# Patient Record
Sex: Female | Born: 1940 | Race: White | Hispanic: No | Smoking: Current some day smoker
Health system: Southern US, Community
[De-identification: ages and names within clinical notes are randomized; demographics above are authoritative.]

## PROBLEM LIST (undated history)

## (undated) ENCOUNTER — Emergency Department (HOSPITAL_COMMUNITY): Discharge status: Against Medical Advice | Payer: Medicare Other

## (undated) DIAGNOSIS — I480 Paroxysmal atrial fibrillation: Secondary | ICD-10-CM

## (undated) DIAGNOSIS — Z720 Tobacco use: Secondary | ICD-10-CM

## (undated) DIAGNOSIS — E559 Vitamin D deficiency, unspecified: Secondary | ICD-10-CM

## (undated) DIAGNOSIS — I10 Essential (primary) hypertension: Secondary | ICD-10-CM

## (undated) DIAGNOSIS — Z950 Presence of cardiac pacemaker: Secondary | ICD-10-CM

## (undated) DIAGNOSIS — Z9289 Personal history of other medical treatment: Secondary | ICD-10-CM

## (undated) DIAGNOSIS — E079 Disorder of thyroid, unspecified: Secondary | ICD-10-CM

## (undated) DIAGNOSIS — E785 Hyperlipidemia, unspecified: Secondary | ICD-10-CM

## (undated) DIAGNOSIS — I422 Other hypertrophic cardiomyopathy: Secondary | ICD-10-CM

## (undated) DIAGNOSIS — I2510 Atherosclerotic heart disease of native coronary artery without angina pectoris: Secondary | ICD-10-CM

## (undated) DIAGNOSIS — J449 Chronic obstructive pulmonary disease, unspecified: Secondary | ICD-10-CM

## (undated) DIAGNOSIS — I701 Atherosclerosis of renal artery: Secondary | ICD-10-CM

## (undated) HISTORY — PX: OTHER SURGICAL HISTORY: SHX169

## (undated) HISTORY — DX: Personal history of other medical treatment: Z92.89

## (undated) HISTORY — DX: Paroxysmal atrial fibrillation: I48.0

## (undated) HISTORY — DX: Chronic obstructive pulmonary disease, unspecified: J44.9

## (undated) HISTORY — DX: Vitamin D deficiency, unspecified: E55.9

## (undated) HISTORY — PX: BREAST BIOPSY: SHX20

## (undated) HISTORY — DX: Atherosclerosis of renal artery: I70.1

## (undated) HISTORY — DX: Tobacco use: Z72.0

## (undated) HISTORY — DX: Hyperlipidemia, unspecified: E78.5

## (undated) HISTORY — DX: Other hypertrophic cardiomyopathy: I42.2

---

## 1967-03-27 HISTORY — PX: TUBAL LIGATION: SHX77

## 1997-05-21 ENCOUNTER — Ambulatory Visit (HOSPITAL_COMMUNITY): Admission: RE | Admit: 1997-05-21 | Discharge: 1997-05-21 | Payer: Self-pay | Admitting: Internal Medicine

## 1998-05-31 ENCOUNTER — Ambulatory Visit (HOSPITAL_COMMUNITY): Admission: RE | Admit: 1998-05-31 | Discharge: 1998-05-31 | Payer: Self-pay | Admitting: Internal Medicine

## 1998-05-31 ENCOUNTER — Encounter: Payer: Self-pay | Admitting: Internal Medicine

## 1998-06-22 ENCOUNTER — Encounter: Payer: Self-pay | Admitting: Internal Medicine

## 1998-06-22 ENCOUNTER — Ambulatory Visit (HOSPITAL_COMMUNITY): Admission: RE | Admit: 1998-06-22 | Discharge: 1998-06-22 | Payer: Self-pay | Admitting: Internal Medicine

## 1998-06-30 ENCOUNTER — Ambulatory Visit (HOSPITAL_COMMUNITY): Admission: RE | Admit: 1998-06-30 | Discharge: 1998-06-30 | Payer: Self-pay | Admitting: Internal Medicine

## 1998-06-30 ENCOUNTER — Encounter: Payer: Self-pay | Admitting: Internal Medicine

## 1998-10-20 ENCOUNTER — Observation Stay (HOSPITAL_COMMUNITY): Admission: RE | Admit: 1998-10-20 | Discharge: 1998-10-21 | Payer: Self-pay | Admitting: Cardiovascular Disease

## 1998-10-20 ENCOUNTER — Encounter: Payer: Self-pay | Admitting: Cardiovascular Disease

## 1998-10-20 HISTORY — PX: RENAL ARTERY ANGIOPLASTY: SHX2317

## 1998-12-15 ENCOUNTER — Encounter: Payer: Self-pay | Admitting: Emergency Medicine

## 1998-12-15 ENCOUNTER — Inpatient Hospital Stay (HOSPITAL_COMMUNITY): Admission: EM | Admit: 1998-12-15 | Discharge: 1998-12-16 | Payer: Self-pay | Admitting: Emergency Medicine

## 1999-07-04 ENCOUNTER — Ambulatory Visit (HOSPITAL_COMMUNITY): Admission: RE | Admit: 1999-07-04 | Discharge: 1999-07-04 | Payer: Self-pay | Admitting: Internal Medicine

## 1999-07-04 ENCOUNTER — Encounter: Payer: Self-pay | Admitting: Internal Medicine

## 1999-12-14 ENCOUNTER — Encounter (INDEPENDENT_AMBULATORY_CARE_PROVIDER_SITE_OTHER): Payer: Self-pay | Admitting: Specialist

## 1999-12-14 ENCOUNTER — Other Ambulatory Visit: Admission: RE | Admit: 1999-12-14 | Discharge: 1999-12-14 | Payer: Self-pay | Admitting: Otolaryngology

## 2000-05-08 ENCOUNTER — Other Ambulatory Visit: Admission: RE | Admit: 2000-05-08 | Discharge: 2000-05-08 | Payer: Self-pay | Admitting: Internal Medicine

## 2000-05-09 ENCOUNTER — Ambulatory Visit (HOSPITAL_COMMUNITY): Admission: RE | Admit: 2000-05-09 | Discharge: 2000-05-09 | Payer: Self-pay | Admitting: Internal Medicine

## 2000-05-09 ENCOUNTER — Encounter: Payer: Self-pay | Admitting: Internal Medicine

## 2000-07-23 ENCOUNTER — Ambulatory Visit (HOSPITAL_COMMUNITY): Admission: RE | Admit: 2000-07-23 | Discharge: 2000-07-23 | Payer: Self-pay | Admitting: Internal Medicine

## 2000-07-23 ENCOUNTER — Encounter: Payer: Self-pay | Admitting: Internal Medicine

## 2001-08-07 ENCOUNTER — Encounter: Payer: Self-pay | Admitting: Internal Medicine

## 2001-08-07 ENCOUNTER — Ambulatory Visit (HOSPITAL_COMMUNITY): Admission: RE | Admit: 2001-08-07 | Discharge: 2001-08-07 | Payer: Self-pay | Admitting: Internal Medicine

## 2001-09-05 ENCOUNTER — Encounter: Payer: Self-pay | Admitting: Internal Medicine

## 2001-09-05 ENCOUNTER — Ambulatory Visit (HOSPITAL_COMMUNITY): Admission: RE | Admit: 2001-09-05 | Discharge: 2001-09-05 | Payer: Self-pay | Admitting: Internal Medicine

## 2001-09-10 ENCOUNTER — Encounter: Payer: Self-pay | Admitting: Internal Medicine

## 2001-09-10 ENCOUNTER — Encounter: Admission: RE | Admit: 2001-09-10 | Discharge: 2001-09-10 | Payer: Self-pay | Admitting: Internal Medicine

## 2001-09-11 ENCOUNTER — Encounter: Admission: RE | Admit: 2001-09-11 | Discharge: 2001-09-11 | Payer: Self-pay | Admitting: Urology

## 2001-09-11 ENCOUNTER — Encounter: Payer: Self-pay | Admitting: Urology

## 2001-09-15 ENCOUNTER — Encounter: Payer: Self-pay | Admitting: Urology

## 2001-09-15 ENCOUNTER — Ambulatory Visit (HOSPITAL_BASED_OUTPATIENT_CLINIC_OR_DEPARTMENT_OTHER): Admission: RE | Admit: 2001-09-15 | Discharge: 2001-09-15 | Payer: Self-pay | Admitting: Urology

## 2001-09-18 ENCOUNTER — Encounter: Payer: Self-pay | Admitting: Emergency Medicine

## 2001-09-19 ENCOUNTER — Observation Stay (HOSPITAL_COMMUNITY): Admission: EM | Admit: 2001-09-19 | Discharge: 2001-09-19 | Payer: Self-pay | Admitting: *Deleted

## 2001-09-22 ENCOUNTER — Encounter: Admission: RE | Admit: 2001-09-22 | Discharge: 2001-09-22 | Payer: Self-pay | Admitting: Urology

## 2001-09-22 ENCOUNTER — Encounter: Payer: Self-pay | Admitting: Urology

## 2001-09-29 ENCOUNTER — Encounter: Payer: Self-pay | Admitting: Urology

## 2001-09-29 ENCOUNTER — Encounter: Admission: RE | Admit: 2001-09-29 | Discharge: 2001-09-29 | Payer: Self-pay | Admitting: Urology

## 2001-10-14 ENCOUNTER — Encounter: Admission: RE | Admit: 2001-10-14 | Discharge: 2001-10-14 | Payer: Self-pay | Admitting: Urology

## 2001-10-14 ENCOUNTER — Encounter: Payer: Self-pay | Admitting: Urology

## 2002-08-14 ENCOUNTER — Ambulatory Visit (HOSPITAL_COMMUNITY): Admission: RE | Admit: 2002-08-14 | Discharge: 2002-08-14 | Payer: Self-pay | Admitting: Internal Medicine

## 2002-08-14 ENCOUNTER — Encounter: Payer: Self-pay | Admitting: Internal Medicine

## 2002-08-21 ENCOUNTER — Encounter: Payer: Self-pay | Admitting: Internal Medicine

## 2002-08-21 ENCOUNTER — Encounter: Admission: RE | Admit: 2002-08-21 | Discharge: 2002-08-21 | Payer: Self-pay | Admitting: Internal Medicine

## 2002-12-09 ENCOUNTER — Encounter (HOSPITAL_COMMUNITY): Admission: RE | Admit: 2002-12-09 | Discharge: 2003-03-09 | Payer: Self-pay | Admitting: Internal Medicine

## 2002-12-10 ENCOUNTER — Encounter: Payer: Self-pay | Admitting: Internal Medicine

## 2003-08-16 ENCOUNTER — Encounter: Admission: RE | Admit: 2003-08-16 | Discharge: 2003-08-16 | Payer: Self-pay | Admitting: Internal Medicine

## 2005-01-22 ENCOUNTER — Encounter: Admission: RE | Admit: 2005-01-22 | Discharge: 2005-01-22 | Payer: Self-pay | Admitting: Internal Medicine

## 2005-02-19 ENCOUNTER — Ambulatory Visit (HOSPITAL_COMMUNITY): Admission: RE | Admit: 2005-02-19 | Discharge: 2005-02-19 | Payer: Self-pay | Admitting: Ophthalmology

## 2005-02-21 ENCOUNTER — Ambulatory Visit (HOSPITAL_COMMUNITY): Admission: RE | Admit: 2005-02-21 | Discharge: 2005-02-21 | Payer: Self-pay | Admitting: Ophthalmology

## 2005-07-22 ENCOUNTER — Inpatient Hospital Stay (HOSPITAL_COMMUNITY): Admission: EM | Admit: 2005-07-22 | Discharge: 2005-07-28 | Payer: Self-pay | Admitting: Emergency Medicine

## 2005-07-23 HISTORY — PX: CARDIAC CATHETERIZATION: SHX172

## 2005-07-24 DIAGNOSIS — Z950 Presence of cardiac pacemaker: Secondary | ICD-10-CM

## 2005-07-24 HISTORY — DX: Presence of cardiac pacemaker: Z95.0

## 2005-07-24 HISTORY — PX: PACEMAKER INSERTION: SHX728

## 2005-07-30 ENCOUNTER — Encounter: Admission: RE | Admit: 2005-07-30 | Discharge: 2005-07-30 | Payer: Self-pay | Admitting: Cardiology

## 2005-08-08 ENCOUNTER — Encounter: Admission: RE | Admit: 2005-08-08 | Discharge: 2005-08-08 | Payer: Self-pay | Admitting: Thoracic Surgery

## 2005-09-14 ENCOUNTER — Emergency Department (HOSPITAL_COMMUNITY): Admission: EM | Admit: 2005-09-14 | Discharge: 2005-09-14 | Payer: Self-pay | Admitting: Emergency Medicine

## 2006-01-16 ENCOUNTER — Observation Stay (HOSPITAL_COMMUNITY): Admission: EM | Admit: 2006-01-16 | Discharge: 2006-01-16 | Payer: Self-pay | Admitting: Emergency Medicine

## 2006-01-23 ENCOUNTER — Encounter: Admission: RE | Admit: 2006-01-23 | Discharge: 2006-01-23 | Payer: Self-pay | Admitting: Internal Medicine

## 2006-12-11 ENCOUNTER — Emergency Department (HOSPITAL_COMMUNITY): Admission: EM | Admit: 2006-12-11 | Discharge: 2006-12-11 | Payer: Self-pay | Admitting: Emergency Medicine

## 2007-01-28 ENCOUNTER — Encounter: Admission: RE | Admit: 2007-01-28 | Discharge: 2007-01-28 | Payer: Self-pay | Admitting: Internal Medicine

## 2007-02-05 ENCOUNTER — Encounter: Admission: RE | Admit: 2007-02-05 | Discharge: 2007-02-05 | Payer: Self-pay | Admitting: Internal Medicine

## 2007-03-11 ENCOUNTER — Other Ambulatory Visit: Admission: RE | Admit: 2007-03-11 | Discharge: 2007-03-11 | Payer: Self-pay | Admitting: Internal Medicine

## 2007-03-17 ENCOUNTER — Ambulatory Visit (HOSPITAL_COMMUNITY): Admission: RE | Admit: 2007-03-17 | Discharge: 2007-03-17 | Payer: Self-pay | Admitting: Internal Medicine

## 2007-07-25 ENCOUNTER — Encounter: Admission: RE | Admit: 2007-07-25 | Discharge: 2007-07-25 | Payer: Self-pay | Admitting: Internal Medicine

## 2008-01-05 ENCOUNTER — Encounter: Admission: RE | Admit: 2008-01-05 | Discharge: 2008-01-05 | Payer: Self-pay | Admitting: Internal Medicine

## 2008-02-16 ENCOUNTER — Encounter: Admission: RE | Admit: 2008-02-16 | Discharge: 2008-02-16 | Payer: Self-pay | Admitting: Internal Medicine

## 2008-04-01 ENCOUNTER — Encounter: Payer: Self-pay | Admitting: Gastroenterology

## 2008-04-23 ENCOUNTER — Ambulatory Visit: Payer: Self-pay | Admitting: Gastroenterology

## 2008-04-27 ENCOUNTER — Telehealth: Payer: Self-pay | Admitting: Gastroenterology

## 2008-04-28 ENCOUNTER — Ambulatory Visit: Payer: Self-pay | Admitting: Gastroenterology

## 2008-04-28 DIAGNOSIS — D68318 Other hemorrhagic disorder due to intrinsic circulating anticoagulants, antibodies, or inhibitors: Secondary | ICD-10-CM | POA: Insufficient documentation

## 2008-04-28 DIAGNOSIS — I4891 Unspecified atrial fibrillation: Secondary | ICD-10-CM | POA: Insufficient documentation

## 2008-04-28 DIAGNOSIS — I509 Heart failure, unspecified: Secondary | ICD-10-CM | POA: Insufficient documentation

## 2008-05-07 ENCOUNTER — Ambulatory Visit: Payer: Self-pay | Admitting: Gastroenterology

## 2008-05-24 LAB — HM COLONOSCOPY

## 2009-02-02 ENCOUNTER — Emergency Department (HOSPITAL_COMMUNITY): Admission: EM | Admit: 2009-02-02 | Discharge: 2009-02-02 | Payer: Self-pay | Admitting: Emergency Medicine

## 2009-02-16 ENCOUNTER — Encounter: Admission: RE | Admit: 2009-02-16 | Discharge: 2009-02-16 | Payer: Self-pay | Admitting: Internal Medicine

## 2009-02-25 ENCOUNTER — Encounter: Admission: RE | Admit: 2009-02-25 | Discharge: 2009-02-25 | Payer: Self-pay | Admitting: Internal Medicine

## 2009-05-23 ENCOUNTER — Ambulatory Visit (HOSPITAL_COMMUNITY): Admission: RE | Admit: 2009-05-23 | Discharge: 2009-05-23 | Payer: Self-pay | Admitting: Internal Medicine

## 2010-03-02 ENCOUNTER — Encounter
Admission: RE | Admit: 2010-03-02 | Discharge: 2010-03-02 | Payer: Self-pay | Source: Home / Self Care | Attending: Internal Medicine | Admitting: Internal Medicine

## 2010-05-19 ENCOUNTER — Other Ambulatory Visit (HOSPITAL_COMMUNITY): Payer: Self-pay | Admitting: Internal Medicine

## 2010-05-19 ENCOUNTER — Ambulatory Visit (HOSPITAL_COMMUNITY)
Admission: RE | Admit: 2010-05-19 | Discharge: 2010-05-19 | Disposition: A | Discharge status: Home / Self Care | Payer: Medicare Other | Source: Ambulatory Visit | Attending: Internal Medicine | Admitting: Internal Medicine

## 2010-05-19 DIAGNOSIS — F17200 Nicotine dependence, unspecified, uncomplicated: Secondary | ICD-10-CM | POA: Insufficient documentation

## 2010-05-19 DIAGNOSIS — I10 Essential (primary) hypertension: Secondary | ICD-10-CM

## 2010-05-19 DIAGNOSIS — Z950 Presence of cardiac pacemaker: Secondary | ICD-10-CM | POA: Insufficient documentation

## 2010-05-19 DIAGNOSIS — Z0000 Encounter for general adult medical examination without abnormal findings: Secondary | ICD-10-CM | POA: Insufficient documentation

## 2010-06-28 LAB — BASIC METABOLIC PANEL
CO2: 28 mEq/L (ref 19–32)
Calcium: 9.5 mg/dL (ref 8.4–10.5)
Creatinine, Ser: 0.84 mg/dL (ref 0.4–1.2)
GFR calc Af Amer: >60 mL/min (ref >60)
GFR calc non Af Amer: >60 mL/min (ref >60)
Glucose, Bld: 203 mg/dL — ABNORMAL HIGH (ref 70–99)

## 2010-06-28 LAB — CBC
MCV: 88 fL (ref 78.0–100.0)
Platelets: 259 10*3/uL (ref 150–400)
RBC: 4.52 MIL/uL (ref 3.87–5.11)
WBC: 10.9 10*3/uL — ABNORMAL HIGH (ref 4.0–10.5)

## 2010-06-28 LAB — DIFFERENTIAL
Eosinophils Absolute: 0 10*3/uL (ref 0.0–0.7)
Lymphs Abs: 1.2 10*3/uL (ref 0.7–4.0)
Monocytes Relative: 6 % (ref 3–12)
Neutro Abs: 8.9 10*3/uL — ABNORMAL HIGH (ref 1.7–7.7)
Neutrophils Relative %: 82 % — ABNORMAL HIGH (ref 43–77)

## 2010-06-28 LAB — POCT CARDIAC MARKERS
CKMB, poc: 5.7 ng/mL (ref 1.0–8.0)
Myoglobin, poc: 112 ng/mL (ref 12–200)

## 2010-07-25 LAB — HM PAP SMEAR: HM Pap smear: NORMAL

## 2010-08-11 NOTE — Cardiovascular Report (Signed)
NAME:  Deanna Poole, Deanna Poole NO.:  [id number]   MEDICAL RECORD NO.:  [id number]          PATIENT TYPE:  INP   LOCATION:  2916                         FACILITY:  [facility]   PHYSICIAN:  Darlin Priestly, MD  DATE OF BIRTH:  1940-10-06   DATE OF PROCEDURE:  07/23/2005  DATE OF DISCHARGE:                              CARDIAC CATHETERIZATION   PROCEDURES:  1.  Left heart catheterization.  2.  Coronary angiography.  3.  Left ventriculogram.  4.  Bilateral renal angiogram.   ATTENDING:  Darlin Priestly, MD   COMPLICATIONS:  None.   INDICATIONS:  Deanna Poole is a 70 year old female with a history of  hypertension, PVD, status post a right renal PTA and stenting, history of  intermittent palpitations and presyncope who presented to the ER on July 22, 2005 after a syncopal episode.  She was noted at that time to have  atrial fibrillation with significant pauses documented on telemetry.  She is  now referred for a cardiac catheterization to rule out significant CAD.   DESCRIPTION OF PROCEDURE:  After giving informed consent, the patient was  brought to the cardiac catheterization lab.  The right and left groin was  shaved, prepped and draped in the usual sterile fashion.  Anesthesia and  monitors established.  Using modified Seldinger technique, a #6 French  arterial sheath was inserted in the right femoral vein.  A #6 French  diagnostic catheter was used to perform diagnostic angiography.   The left main is a large vessel with no significant disease.   The LAD is a large vessel which is tortuous and courses through the apex is  one diagonal branch.  There is mild calcification of the proximal LAD.  There is a  50-60% stenosis after the takeoff of the first diagonal,  however, there does not appear to be any further significant LAD disease.   The first diagonal is a medium sized vessel which bifurcates distally with  no significant disease.   The left circumflex is a  medium sized vessel that coursed the AV groove,  giving rise to two medium obtuse marginal branches.  The AV groove  circumflex was noted to be mildly calcified in its proximal segment but has  no high grade stenosis.   The first OM is a medium sized vessel which bifurcates distally with no  significant disease.   The second OM is a small vessel with no significant disease.   The right coronary artery is a large vessel, is dominant and gives rise to a  PDA and posterolateral branch.  There is mild 30% mid RCA narrowing with no  further significant disease.   The PDA and posterolateral branch have no significant disease.   Left ventriculogram reveals hyperdynamic EF at 70% with mid cavity  obliteration.   Right renal artery reveals a stent in the ostial portion.  There is an 80%  ostial stenosis with a 30 mm gradient across the stenotic lesion.   Left renal artery reveals mild 30% ostial narrowing.   HEMODYNAMICS:  Right systolic pressure 132/60, LV systolic pressure 140/90,  LVEDP 19.  CONCLUSIONS:  1.  No significant coronary artery disease.  2.  Hyperdynamic left ventricle with normal function.  There is mid cavity      obliteration.  3.  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  4.  Elevated left ventricular end-diastolic pressure.      Darlin Priestly, MD  Electronically Signed     RHM/MEDQ  D:  07/23/2005  T:  07/23/2005  Job:  [id number]   cc:   Nanetta Batty, M.D.  Fax: [phone]

## 2010-08-11 NOTE — H&P (Signed)
NAME:  Deanna Poole NO.:  [id number]   MEDICAL RECORD NO.:  [id number]          PATIENT TYPE:  OBV   LOCATION:  1826                         FACILITY:  [facility]   PHYSICIAN:  Ulyses Amor, MD DATE OF BIRTH:  Feb 04, 1941   DATE OF ADMISSION:  01/16/2006  DATE OF DISCHARGE:                                HISTORY & PHYSICAL   HISTORY OF PRESENT ILLNESS:  Deanna Poole is a 70 year old white woman  who is admitted to [facility] for recurrent atrial fibrillation.   The patient's first episode of atrial fibrillation occurred in April of this  year.  She was hospitalized.  She spontaneously converted to normal sinus  rhythm, but not before she demonstrated several long pauses.  Because of  that, during that hospitalization, a permanent pacemaker was placed.  She  also underwent cardiac catheterization which demonstrated a 50-60% LAD  lesion and a 30% right coronary artery lesion.  The ejection fraction was  70%.  Since that time, she has experienced one additional episode of atrial  fibrillation, for which she was not hospitalized.  The patient presented to  the emergency department after being awakened by a rapid and irregular heart  beat.  There was no associated chest pain, tightness, heaviness, pressure,  squeezing, syncope, near syncope, dizziness or lightheadedness.  In the  emergency department, she was found to be in atrial fibrillation with a  ventricular rate in the 120s.  She has received 1 intravenous dose of  metoprolol and 1 oral dose of metoprolol.  She remains in atrial  fibrillation, but her heart rate is now in the 90s.   In addition to the problems noted above, the patient has a history of  hypertension and hypothyroidism.  She has previously undergone stenting of  the right renal artery, and at the time of cardiac catheterization she was  found to have an 80% restenosis at this site.   PAST MEDICAL HISTORY:  Otherwise  unremarkable.   MEDICATIONS:  1. Amlodipine.  2. Aspirin.  3. Atenolol.  4. Cozaar.  5. Hydrochlorothiazide.  6. Levothroid.  7. Norvasc.  8. Coumadin.  9. Diltiazem.   ALLERGIES:  None.   OPERATIONS:  None.   SOCIAL HISTORY:  The patient is retired.  She is married and lives with her  husband, who has a chronic illness.  She cares for him.  She discontinued  smoking earlier this year.  She does not drink alcohol.   FAMILY HISTORY:  Notable for breast cancer (sister).   REVIEW OF SYSTEMS:  No new problems related to her head, eyes, ears, nose,  mouth, throat, lungs, gastrointestinal system, genitourinary system or  extremities.  There is no history of neurologic or psychiatric disorder.  There is no history of fever, chills or weight loss.   PHYSICAL EXAMINATION:  VITAL SIGNS:  Blood pressure 136/73, pulse 127 and  irregularly irregular, respirations 24, temperature 98.5.  Pulse oximetry  94% on room air.  GENERAL:  The patient was an older white woman in no discomfort.  She was  alert, oriented, appropriate and responsive.  HEENT:  Head,  eyes, nose and mouth were normal.  NECK:  The neck was without thyromegaly or adenopathy.  Carotid pulses were  palpable bilaterally and without bruits.  CARDIAC:  An irregularly irregular rhythm.  There was no rub or click.  There was a soft systolic ejection murmur heard at the left upper sternal  border.  No chest wall tenderness was noted.  LUNGS:  Clear.  ABDOMEN:  Soft and nontender.  There was no mass, hepatosplenomegaly, bruit,  distention, rebound, guarding or rigidity.  Bowel sounds were normal.  BREASTS/PELVIC/RECTAL:  Not performed, as they were not pertinent to the  reason for acute care hospitalization.  EXTREMITIES:  Without edema, deviation or deformity.  Radial and dorsalis  pedal pulses were palpable bilaterally.  NEUROLOGIC:  Brief screening neurologic survey was unremarkable.   ELECTROCARDIOGRAM:  The  electrocardiogram revealed atrial fibrillation with  a ventricular rate of 115 beats per minute.  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  Left ventricular hypertrophy was  present with secondary QRS widening and repolarization abnormality.   CHEST RADIOGRAPH:  Her chest radiograph had not yet been performed at the  time of this dictation.   LABORATORY DATA:  The initial set of cardiac markers revealed a myoglobin of  52.  The second set of cardiac markers revealed a myoglobin of 54.3.  CK-MB  of 2.7 and troponin less than 0.__________ 3.6, BUN 12, and creatinine 0.7.  The remaining studies were pending at the time of dictation.   IMPRESSION:  1. Recurrent atrial fibrillation with rapid ventricular rate.  2. Status post permanent pacemaker for sick sinus syndrome.  3. 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  4. Hypertension.  5. Nonobstructive coronary artery disease, 50-60% LAD lesion, 30% right      coronary artery lesion, and ejection fraction of 70%.  6. Hypothyroidism.   PLAN:  1. Telemetry.  2. Serial cardiac enzymes.  3. Intravenous Diltiazem if needed for further rate control; metoprolol      given in the emergency department.  4. Consider addressing right renal artery restenosis during this hospital      stay.  5. No heparin, as INR is 2.4.  6. Further measures per Dr. Jenne Campus.      Ulyses Amor, MD  Electronically Signed     MSC/MEDQ  D:  01/16/2006  T:  01/16/2006  Job:  [id number]   cc:   Darlin Priestly, MD

## 2010-08-11 NOTE — Discharge Summary (Signed)
NAME:  Deanna Poole, Deanna Poole NO.:  [id number]   MEDICAL RECORD NO.:  [id number]          PATIENT TYPE:  INP   LOCATION:  2036                         FACILITY:  [facility]   PHYSICIAN:  Cristy Hilts. Jacinto Halim, MD       DATE OF BIRTH:  1940-10-18   DATE OF ADMISSION:  07/22/2005  DATE OF DISCHARGE:  07/28/2005                                 DISCHARGE SUMMARY   HISTORY OF PRESENT ILLNESS AND HOSPITAL COURSE:  Ms. Deanna Poole came into the  hospital with presentation of syncope.  She apparently was having  palpitations the night before.  She was weak, diaphoretic, and nauseated.  She was brought to the emergency room.  She was found to be in atrial  fibrillation with a rapid ventricular response, and then she had a long  episode of asystole and marked bradycardia and then converted to sinus  rhythm.  She was admitted to the hospital.  It was decided that she would  need to undergo cardiac catheterization and possible pacemaker implantation.  She had a cardiac catheterization on July 23, 2005 that showed minimal  disease, nonobstructive; 50% in the LAD which was tortuous, and a 30% in her  mid-RCA.  She also had a right renal stent and an 80% ostial stenosis.  In  her left renal, she had 30% ostial stenosis.  She then went on to have a  pacemaker implanted by Dr. Lenise Herald on July 23, 2005.  She had a  Medtronic EnRhythm [id number] implanted with two Medtronic leads also.  Please  see his note for complete details.   She was going to be discharged home on the following day.  She was feeling  fine; however, her chest x-ray came back which showed a pneumothorax.  A  consult was called to Dr. Edwyna Shell, and she had a right chest tube inserted.  This remained in for several days until Jul 27, 2005 when it was  discontinued.  The pneumothorax was resolved.  Her chest x-ray post-chest  tube explantation showed 3% apical pneumothorax.   She was seen on Jul 28, 2005 by Dr. Nicki Guadalajara and  considered stable for  discharge home.  Her blood pressure was 142/61, heart rate 61, respirations  18, temperature 96.6, O2 saturations were 92% on room air.   LABORATORY DATA:  Her total cholesterol was 138, LDL 73, HDL 38, and  triglycerides were 135.  On July 23, 2005, hemoglobin 11.2, hematocrit 32,  WBC 7.7, and platelet count was 152.  Her sodium was 138, potassium 3.7,  glucose 125, BUN 13, creatinine 0.6.  CK-MB's were negative x2.  Troponin  was slightly elevated at 0.05, 0.06, and 0.06.   DISCHARGE MEDICATIONS:  1.  Cozaar 50 mg one time per day.  2.  Levothyroxine 75 mcg one time per day.  3.  Aspirin 81 mg one time per day.  4.  Diltiazem 180 mg one time per day.  5.  Atenolol 25 mg two times per day.  6.  Coumadin 5 mg everyday, except 2.5 mg on Monday, Wednesday, and Friday.   FOLLOW UP:  She  will follow up with Dr. Edwyna Shell.  Their office will call her  for followup.  She should see Deanna Poole on Aug 02, 2005 for pacemaker site  check.  She will have a protime checked on Wednesday.   DISCHARGE INSTRUCTIONS:  She should have continued restrictions of no  reaching, lifting, pushing, pulling with her arm on her pacer side.  She may  take a shower in two days.  She may wash the pacer site with soap and water.   DISCHARGE DIAGNOSES:  1.  Syncope.  2.  Proximal atrial fibrillation with rapid response with conversion of long      episode of asystole and marked bradycardia and then converted to sinus      rhythm.  3.  Status post PTVDP placement for #1 and #2.  4.  Coronary artery disease, nonobstructive, mild; two-vessel disease, as      described above.  5.  Hypertension.  6.  Hypothyroidism.  The patient's thyroid medication was held on admission      because her thyroid stimulating hormone was 0.093.  Thus, it was held      until the day of discharge when she was started on a lower dose of 75      mcg everyday.  We think she should have her thyroid function retested  in      approximately two to three weeks.  7.  Anticoagulation.  Coumadin being started as an outpatient.  Not started      as an inpatient because of pacemaker insertion and chest tube.  She is      on this now for proximal atrial fibrillation.  8.  Atherosclerotic peripheral vascular disease with history of right renal      stenting.  Per catheterization report, she has an 80% ostial end-stent      restenosis of the right renal artery stent with a 30-mm pressure      gradient across the lesion.  This will need to be worked up as an      outpatient.  She will need to undergo staged procedure.      Lezlie Octave, N.P.      Cristy Hilts. Jacinto Halim, MD  Electronically Signed    BB/MEDQ  D:  07/28/2005  T:  07/30/2005  Job:  [id number]   cc:   Nanetta Batty, M.D.  Fax: [phone]   Darlin Priestly, MD  Fax: [phone]   Jeoffrey Massed, MD  Fax: [phone]

## 2010-08-11 NOTE — Discharge Summary (Signed)
NAME:  Deanna Poole, Deanna Poole            ACCOUNT NO.:  [id number]   MEDICAL RECORD NO.:  [id number]          PATIENT TYPE:  OBV   LOCATION:  6526                         FACILITY:  [facility]   PHYSICIAN:  Lezlie Octave, N.P.     DATE OF BIRTH:  1940/05/28   DATE OF ADMISSION:  01/15/2006  DATE OF DISCHARGE:  01/16/2006                                 DISCHARGE SUMMARY   [name] was admitted for observation.  She came into the hospital with  recurrent atrial fib with rapid ventricular rate.  She was seen by Dr.  Effie Shy who was on-call for our service.  She has a previous history of  atrial fib and also with a very long pause asystole, that she underwent  PTVDP placement.  She underwent cardiac catheterization in the past with a  50-60% LAD and a 30% RCA.  Her EF is 70%.  She apparently was awakened with  a rapid irregular heart beat.  She had no chest pain, tightness, heaviness  or pressure.  She was found to be in atrial fib at the rate of 120.  She was  given one IV dose of metoprolol 5 mg and 1 oral dose of metoprolol.  She was  in atrial fib and a rate of 90.  At sometime during the night, she did  convert to sinus rhythm.  She was atrial pacing intermittently.  Her pulse  was in the 60s.  Her blood pressure on the morning of January 16, 2006, was  103/41.  Her temperature is 97.9.  Her O2 saturations were 95%.  She was  seen by Dr. Jenne Campus.  He recommended sotalol therapy.  She did not want to  try that now.  She just wanted to try to increase her atenolol for now.  She  was considered stable for discharge home.  Her CK-MBs and troponins were all  negative.  Her INR was therapeutic at 2.4.  At the time of discharge, her  TSH was pending.  Her sodium was 141, potassium 4.0, BUN 10, creatinine 0.6.  Her hemoglobin was 12.3, hematocrit 35.2, WBCs 12.9, and platelets were 165.  Her BNP was 284.   DISCHARGE MEDICATIONS:  1. Atenolol 50 mg 2 times per day.  2. Cozaar 50 mg 1 time per day.   She was told that she could hold that if      her blood pressure was less than 100.  3. Aspirin 81 mg 1 time per day.  4. Multivitamin 1 time per day.  5. Levothroid 75 mcg 1 time per day.  6. Coumadin as taken previously.  7. Diltiazem 180 mg 1 time per day.  8. Hydrochlorothiazide.  She should now only take it if she needs it for      swelling.   She is to followup with Dr. Jenne Campus in 2 weeks for pacemaker interrogation  and she will followup with Dr. Allyson Sabal as previously scheduled.   DISCHARGE DIAGNOSES:  1. Recurrent atrial fibrillation with rapid ventricular response, rate      controlled with higher dose of beta blocker.  She did convert to sinus  rhythm while in the hospital.  2. Anticoagulation.  International normalized ratio therapeutic at 2.4.  3. History of sick sinus syndrome with subsequent PTVDP.  Because of a      very long pause, considered asystole.  4. Atherosclerotic cardiovascular disease.  No known obstructive disease      by catheterization, 06/2005.  5. Atherosclerotic peripheral vascular disease with history of right renal      artery stent with in-stent restenosis of      80%.  She has seen Dr. Allyson Sabal in followup for this and at this time, it      is being watched.  6. Hypothyroidism.  At her last hospitalization, it was noted that her      thyroid-stimulating hormone was low.  Her thyroid medication was      decreased.  Her thyroid-stimulating hormone at this time is pending.      Lezlie Octave, N.P.     BB/MEDQ  D:  01/16/2006  T:  01/17/2006  Job:  [id number]

## 2010-08-11 NOTE — Op Note (Signed)
NAME:  Deanna Poole, Deanna Poole NO.:  [id number]   MEDICAL RECORD NO.:  [id number]          PATIENT TYPE:  INP   LOCATION:  2916                         FACILITY:  [facility]   PHYSICIAN:  Darlin Priestly, MD  DATE OF BIRTH:  1940/08/16   DATE OF PROCEDURE:  07/23/2005  DATE OF DISCHARGE:                                 OPERATIVE REPORT   PROCEDURE:  Insertion of a Medtronic EnRhythm P1501-DR generator, Serial  [id number], with passive atrial and ventricular leads.   ATTENDING:  Darlin Priestly, MD.   COMPLICATIONS:  None.   INDICATION:  Deanna Poole is a 70 year old female patient of Dr. Nanetta Batty, with a history of hypertension, a history of peripheral vascular  disease status post a right renal PTA and stenting.  She was admitted on  July 22, 2005, with syncope and noted to have significant pauses with PAF.  She is now referred for a pacer implant secondary to sick sinus syndrome.   DESCRIPTION OF PROCEDURE:  After giving informed written consent, the  patient was brought to the cardiac cath lab where the right chest was  prepped and draped in a sterile fashion.  An ECG monitor was established.  1% Lidocaine was then used to anesthetize the right subclavian region.  Next, approximately a 3 cm midclavicular horizontal incision was then  carried out, and hemostasis was obtained with electrocautery.  Blunt  dissection used to carry this down to the right pectoral fascia.  Next,  approximately a 3 x 4-cm pocket was created over the pectoral fascia, and  again hemostasis was obtained with electrocautery.  The right subclavian  vein was then entered using the fluoro and contrast guidance, and a 9-French  dilator and sheath were then passed over the retained guidewire, and the  guidewire and dilator were removed.  Following this, a 52 cm passive  Medtronic lead, Model [id number], Serial [id number], was then passed into the  right atrium, the guidewire was retained, and  the [place] sheath was then  removed.  A second 7-French dilator and sheath were then passed over the  retained guidewire, and the guidewire and dilator were removed.  A 45 cm  passive Medtronic lead, Model [id number], Serial [id number], was then passed  into the right atrium, and the sheath was then removed.  A J-curve was then  placed on the ventricular lead stylette, and the ventricular lead was then  allowed to prolapse to the tricuspid valve and positioned in the RV apex  without difficulty.  Thresholds were then determined.  The R-waves were  measured at 27 mV.  Impedence 764 ohms.  Thresholds were 0.3-V at 0.5 msec.  Current was 0.3 mA.  Diaphragmatic stimulation was negative at 10-V.  The  atrial lead was then positioned in the right atrial appendage, and  thresholds then determined.  The P-waves were measured at 1.6 mV.  Impedence  was 493 ohms.  Thresholds were 0.5-V at 0.4 msec.  Current was 1.6 mA with  no 10-V diaphragmatic stimulation noted.  2-0 silk sutures were then secured  to each lead to secure in the pectoral  fascia.  The pocket was then  copiously irrigated with 1% Kanamycin solution.  Hemostasis was again  confirmed.  The leads were then connected in a serial fashion to a Medtronic  EnRhythm PI501-DR generator, Serial [id number], head screws were tightened,  and the patient was confirmed.  A single silk suture was then placed in the  apex pocket.  The generator and leads were then delivered into the pocket,  and the header was then secured to the silk suture.  The subcutaneous layer  of the pocket was then closed using running 2-0 Vicryl.  The skin layer was  then closed using running 4-0 Vicryl.  Steri-Strips were then applied.  The  patient was then transferred to the recovery room in stable condition.   CONCLUSIONS:  Successful implant of a Medtronic EnRhythm P1501-DR generator,  Serial [id number], with passive atrial and ventricular leads.      Darlin Priestly, MD  Electronically Signed     RHM/MEDQ  D:  07/23/2005  T:  07/23/2005  Job:  [id number]   cc:   Nanetta Batty, M.D.  Fax: [phone]

## 2010-08-11 NOTE — H&P (Signed)
NAME:  Deanna Poole, Deanna Poole              ACCOUNT NO.:  [id number]   MEDICAL RECORD NO.:  [id number]            PATIENT TYPE:   LOCATION:                                 FACILITY:   PHYSICIAN:  Vonna Kotyk R. Jacinto Halim, MD            DATE OF BIRTH:   DATE OF ADMISSION:  07/22/2005  DATE OF DISCHARGE:                                HISTORY & PHYSICAL   CHIEF COMPLAINT:  Weakness and palpitations.   HISTORY OF PRESENT ILLNESS:  Patient is a 70 year old female who had been  seen in the past by Dr. Allyson Sabal.  By her history, she had a right renal artery  stent in 2000.  Her primary care doctor is Dr. Milinda Cave.  She was in usual  state of good health until last night when she woke up with palpitations.  As she has in the past, she took an extra atenolol.  Her symptoms did not  improve and she took an extra Cozaar and Norvasc.  She then became very weak  and diaphoretic and nauseated.  This abated, somewhat, and she contacted  some house guests that were staying with her, and came to the emergency  room.  In the emergency room, at [facility] she had documented  atrial fibrillation with rapid response; and then had a long episode of  asystole and marked bradycardia, and then converted to sinus rhythm.  She  did convert spontaneously and did not require CPR.  She is now externally  paced with a __________ pacer in the ER and is awake, alert, and oriented.  She denies any chest pain or unusual dyspnea.  She has had palpitations in  the past.  She has not seen a cardiologist besides Dr. Allyson Sabal; and has no  documented atrial fibrillation by her history.  Her past medical history is  remarkable for history of a heart murmur, she says she has ASH.  She had a  right renal artery stent in 2000 as noted.  She has treated hypertension.  She has had a previous tubal ligation and status post appendectomy in the  remote past.   CURRENT MEDICATIONS:  1.  Atenolol 50 mg b.i.d.  2.  Cozaar 50 mg a day.  3.  Norvasc 5  mg a day.  4.  Aspirin daily.  5.  Multivitamin daily.  6.  Levothroid, she is unsure of the dose.   ALLERGIES:  She has no known drug allergies.   SOCIAL HISTORY:  She is married.  She lives with her husband who has a  chronic illness.  She has a primary care taker.  She is a pack a day smoker,  but quit last month.   FAMILY HISTORY:  Remarkable for breast cancer.  She has a sister who is in  the hospital, now, with breast cancer.  Her mother had a pacemaker.   REVIEW OF SYSTEMS:  Essentially unremarkable except for noted above.  She  denies any history of diabetes.  She denies dyslipidemia.  She has not had  claudication.  She denies any melena  or GI bleeding.  She has had kidney  stones in the past; and has had ultrasound therapy for this.  She  occasionally gets some left flank pain which she attributes to passing  stones.  She denies any gross hematuria recently.   PHYSICAL EXAM:  VITAL SIGNS:  Blood pressure 135/58, pulse 65, respirations  12.  GENERAL:  She is a well-developed, well-nourished female in no acute  distress.  HEENT: Normocephalic, atraumatic.  Extraocular movements are intact.  Sclerae are nonicteric.  NECK:  Without JVD without bruit.  CHEST:  Clear to auscultation and percussion.  CARDIAC:  Reveals regular rate and rhythm.  She has a 2/6 early systolic  murmur at the mitral area and a 2/6 mid systolic murmur at the aortic valve  area with a normal S2.  ABDOMEN:  Nontender.  No hepatosplenomegaly.  EXTREMITIES:  Reveal no edema.  She has bilateral femoral artery bruits.  She has diminished dorsalis pedis pulse on the right compared to the left.  NEUROLOGIC:  Exam is grossly intact.  She is awake, alert, oriented, and  cooperative. She moves all extremities without deficits.  SKIN:  Warm and dry.   LABS:  White count 6.5, hemoglobin 12.4, hematocrit 36.4, platelets 178.  Sodium 138, potassium 3.3, BUN 14, creatinine 0.6, magnesium 1.7.  BNP is  188.   Troponin is negative x1.   EKG:  Now, shows sinus rhythm, LVH diffuse T wave inversion.   IMPRESSION:  1.  Paroxysmal atrial fibrillation with bradycardia and asystole.  2.  Hypertension.  3.  Peripheral vascular disease with a prior right renal artery stenting in      2000.  4.  Chronic obstructive pulmonary disease by history.  5.  Remote history of nephrolithiasis.  6.  Treated hypothyroidism.   PLAN:  The patient was admitted by Dr. Jacinto Halim through the emergency room.  She will be set up for a diagnostic catheterization tomorrow.  She may also  require pacemaker.  This will be further discussed in the morning.  Risk and  benefits of the catheterization were explained to the patient agrees to  proceed.      Abelino Derrick, P.A.      Cristy Hilts. Jacinto Halim, MD  Electronically Signed    LKK/MEDQ  D:  07/22/2005  T:  07/22/2005  Job:  [id number]

## 2010-08-17 ENCOUNTER — Other Ambulatory Visit (HOSPITAL_COMMUNITY)
Admission: RE | Admit: 2010-08-17 | Discharge: 2010-08-17 | Disposition: A | Discharge status: Home / Self Care | Payer: Medicare Other | Source: Ambulatory Visit | Attending: Internal Medicine | Admitting: Internal Medicine

## 2010-08-17 DIAGNOSIS — Z01419 Encounter for gynecological examination (general) (routine) without abnormal findings: Secondary | ICD-10-CM | POA: Insufficient documentation

## 2010-12-25 DIAGNOSIS — Z9289 Personal history of other medical treatment: Secondary | ICD-10-CM

## 2010-12-25 HISTORY — PX: TRANSTHORACIC ECHOCARDIOGRAM: SHX275

## 2010-12-25 HISTORY — DX: Personal history of other medical treatment: Z92.89

## 2011-01-04 LAB — POCT I-STAT CREATININE
Creatinine, Ser: 0.6
Operator id: 277751

## 2011-01-04 LAB — CBC
HCT: 42.4
MCV: 85.8
Platelets: 207
RDW: 14.4 — ABNORMAL HIGH

## 2011-01-04 LAB — DIFFERENTIAL
Basophils Absolute: 0
Basophils Relative: 1
Eosinophils Absolute: 0.3
Eosinophils Relative: 3
Neutrophils Relative %: 44

## 2011-01-04 LAB — I-STAT 8, (EC8 V) (CONVERTED LAB)
BUN: 16
Chloride: 105
Glucose, Bld: 110 — ABNORMAL HIGH
HCT: 44
pCO2, Ven: 53.9 — ABNORMAL HIGH
pH, Ven: 7.352 — ABNORMAL HIGH

## 2011-01-04 LAB — POCT CARDIAC MARKERS
CKMB, poc: 1.9
Myoglobin, poc: 51
Operator id: 277751
Operator id: 277751

## 2011-02-07 ENCOUNTER — Other Ambulatory Visit: Payer: Self-pay | Admitting: Internal Medicine

## 2011-02-07 DIAGNOSIS — Z1231 Encounter for screening mammogram for malignant neoplasm of breast: Secondary | ICD-10-CM

## 2011-03-05 ENCOUNTER — Ambulatory Visit: Payer: Medicare Other

## 2011-03-06 ENCOUNTER — Ambulatory Visit: Payer: Medicare Other

## 2011-04-02 ENCOUNTER — Ambulatory Visit
Admission: RE | Admit: 2011-04-02 | Discharge: 2011-04-02 | Disposition: A | Discharge status: Home / Self Care | Payer: Medicare Other | Source: Ambulatory Visit | Attending: Internal Medicine | Admitting: Internal Medicine

## 2011-04-02 DIAGNOSIS — Z1231 Encounter for screening mammogram for malignant neoplasm of breast: Secondary | ICD-10-CM

## 2011-06-06 ENCOUNTER — Ambulatory Visit (HOSPITAL_COMMUNITY)
Admission: RE | Admit: 2011-06-06 | Discharge: 2011-06-06 | Disposition: A | Discharge status: Home / Self Care | Payer: Medicare Other | Source: Ambulatory Visit | Attending: Internal Medicine | Admitting: Internal Medicine

## 2011-06-06 ENCOUNTER — Other Ambulatory Visit (HOSPITAL_COMMUNITY): Payer: Self-pay | Admitting: Internal Medicine

## 2011-06-06 DIAGNOSIS — R059 Cough, unspecified: Secondary | ICD-10-CM | POA: Insufficient documentation

## 2011-06-06 DIAGNOSIS — F17200 Nicotine dependence, unspecified, uncomplicated: Secondary | ICD-10-CM | POA: Insufficient documentation

## 2011-06-06 DIAGNOSIS — I10 Essential (primary) hypertension: Secondary | ICD-10-CM | POA: Insufficient documentation

## 2011-06-06 DIAGNOSIS — R05 Cough: Secondary | ICD-10-CM | POA: Insufficient documentation

## 2011-06-06 DIAGNOSIS — I517 Cardiomegaly: Secondary | ICD-10-CM | POA: Insufficient documentation

## 2012-04-07 ENCOUNTER — Other Ambulatory Visit: Payer: Self-pay | Admitting: Internal Medicine

## 2012-04-07 DIAGNOSIS — Z1231 Encounter for screening mammogram for malignant neoplasm of breast: Secondary | ICD-10-CM

## 2012-04-17 ENCOUNTER — Ambulatory Visit: Payer: Medicare Other

## 2012-04-29 ENCOUNTER — Emergency Department (HOSPITAL_COMMUNITY)
Admission: EM | Admit: 2012-04-29 | Discharge: 2012-04-29 | Disposition: A | Discharge status: Home / Self Care | Payer: Medicare Other | Attending: Emergency Medicine | Admitting: Emergency Medicine

## 2012-04-29 ENCOUNTER — Emergency Department (HOSPITAL_COMMUNITY): Payer: Medicare Other

## 2012-04-29 ENCOUNTER — Encounter (HOSPITAL_COMMUNITY): Payer: Self-pay | Admitting: Emergency Medicine

## 2012-04-29 DIAGNOSIS — I428 Other cardiomyopathies: Secondary | ICD-10-CM | POA: Insufficient documentation

## 2012-04-29 DIAGNOSIS — I2510 Atherosclerotic heart disease of native coronary artery without angina pectoris: Secondary | ICD-10-CM | POA: Insufficient documentation

## 2012-04-29 DIAGNOSIS — M79662 Pain in left lower leg: Secondary | ICD-10-CM

## 2012-04-29 DIAGNOSIS — M79609 Pain in unspecified limb: Secondary | ICD-10-CM | POA: Insufficient documentation

## 2012-04-29 DIAGNOSIS — Z7982 Long term (current) use of aspirin: Secondary | ICD-10-CM | POA: Insufficient documentation

## 2012-04-29 DIAGNOSIS — I10 Essential (primary) hypertension: Secondary | ICD-10-CM | POA: Insufficient documentation

## 2012-04-29 DIAGNOSIS — E079 Disorder of thyroid, unspecified: Secondary | ICD-10-CM | POA: Insufficient documentation

## 2012-04-29 DIAGNOSIS — Z79899 Other long term (current) drug therapy: Secondary | ICD-10-CM | POA: Insufficient documentation

## 2012-04-29 DIAGNOSIS — Z7901 Long term (current) use of anticoagulants: Secondary | ICD-10-CM | POA: Insufficient documentation

## 2012-04-29 DIAGNOSIS — Z950 Presence of cardiac pacemaker: Secondary | ICD-10-CM | POA: Insufficient documentation

## 2012-04-29 DIAGNOSIS — Z87891 Personal history of nicotine dependence: Secondary | ICD-10-CM | POA: Insufficient documentation

## 2012-04-29 HISTORY — DX: Atherosclerotic heart disease of native coronary artery without angina pectoris: I25.10

## 2012-04-29 HISTORY — DX: Essential (primary) hypertension: I10

## 2012-04-29 HISTORY — DX: Presence of cardiac pacemaker: Z95.0

## 2012-04-29 HISTORY — DX: Disorder of thyroid, unspecified: E07.9

## 2012-04-29 LAB — CBC WITH DIFFERENTIAL/PLATELET
Basophils Absolute: 0 10*3/uL (ref 0.0–0.1)
Basophils Relative: 0 % (ref 0–1)
Eosinophils Relative: 5 % (ref 0–5)
HCT: 38.1 % (ref 36.0–46.0)
Hemoglobin: 13.3 g/dL (ref 12.0–15.0)
MCH: 30 pg (ref 26.0–34.0)
MCHC: 34.9 g/dL (ref 30.0–36.0)
MCV: 85.8 fL (ref 78.0–100.0)
Monocytes Absolute: 0.7 10*3/uL (ref 0.1–1.0)
Monocytes Relative: 11 % (ref 3–12)
Neutro Abs: 2.8 10*3/uL (ref 1.7–7.7)
RDW: 13.3 % (ref 11.5–15.5)

## 2012-04-29 LAB — PROTIME-INR: INR: 2.2 — ABNORMAL HIGH (ref 0.00–1.49)

## 2012-04-29 LAB — BASIC METABOLIC PANEL
BUN: 18 mg/dL (ref 6–23)
CO2: 30 mEq/L (ref 19–32)
Chloride: 102 mEq/L (ref 96–112)
Creatinine, Ser: 0.71 mg/dL (ref 0.50–1.10)
Glucose, Bld: 178 mg/dL — ABNORMAL HIGH (ref 70–99)
Potassium: 3.8 mEq/L (ref 3.5–5.1)

## 2012-04-29 MED ORDER — ONDANSETRON 4 MG PO TBDP
8.0000 mg | ORAL_TABLET | Freq: Once | ORAL | Status: AC
  Administered 2012-04-29: 8 mg via ORAL

## 2012-04-29 MED ORDER — OXYCODONE-ACETAMINOPHEN 5-325 MG PO TABS: 1.0000 | ORAL_TABLET | Freq: Three times a day (TID) | ORAL | Status: DC | PRN

## 2012-04-29 MED ORDER — HYDROCODONE-ACETAMINOPHEN 5-325 MG PO TABS
2.0000 | ORAL_TABLET | Freq: Once | ORAL | Status: AC
  Administered 2012-04-29: 2 via ORAL
  Filled 2012-04-29: qty 2

## 2012-04-29 MED ORDER — ONDANSETRON 4 MG PO TBDP
ORAL_TABLET | ORAL | Status: AC
  Administered 2012-04-29: 8 mg via ORAL
  Filled 2012-04-29: qty 2

## 2012-04-29 NOTE — Progress Notes (Signed)
Orthopedic Tech Progress Note Patient Details:  Deanna Poole February 13, 1941 [id number]  Ortho Devices Type of Ortho Device: Crutches Ortho Device/Splint Location: crutches Ortho Device/Splint Interventions: Application   Jennye Moccasin 04/29/2012, 9:32 PM

## 2012-04-29 NOTE — ED Provider Notes (Signed)
History     CSN: [id number]  Arrival date & time 04/29/12  6295   First MD Initiated Contact with Patient 04/29/12 1909      Chief Complaint  Patient presents with  . Leg Pain     Patient is a 71 y.o. female presenting with leg pain. The history is provided by the patient.  Leg Pain  The incident occurred more than 2 days ago. There was no injury mechanism. Pain location: left calf. The quality of the pain is described as aching. The pain is moderate. The pain has been constant since onset. Pertinent negatives include no numbness, no muscle weakness and no tingling. The symptoms are aggravated by bearing weight and activity. She has tried rest and NSAIDs for the symptoms. The treatment provided no relief.   Pt reports onset of left LE pain last week that is worsening Denies known trauma No cp/sob.  No weakness or numbness reported.  No discoloration of the leg is reported. She denies h/o DVT She does have h/o cardiomyopathy and CAD, and has pacemaker in place.  She takes coumadin daily She was seen by [facility] recently and was told she might have bursitis Pt thinks she may have DVT Past Medical History  Diagnosis Date  . Coronary artery disease   . Hypertension   . Thyroid disease   . Pacemaker     History reviewed. No pertinent past surgical history.  History reviewed. No pertinent family history.  History  Substance Use Topics  . Smoking status: Former Games developer  . Smokeless tobacco: Not on file  . Alcohol Use: No    OB History    Grav Para Term Preterm Abortions TAB SAB Ect Mult Living                  Review of Systems  Respiratory: Negative for shortness of breath.   Cardiovascular: Negative for chest pain.  Gastrointestinal: Negative for abdominal pain.  Musculoskeletal: Negative for back pain.  Neurological: Negative for tingling, weakness and numbness.  Psychiatric/Behavioral: Negative for agitation.  All other systems reviewed and are  negative.    Allergies  Review of patient's allergies indicates no known allergies.  Home Medications   Current Outpatient Rx  Name  Route  Sig  Dispense  Refill  . ASPIRIN 81 MG PO CHEW   Oral   Chew 81 mg by mouth daily.         . ATENOLOL 100 MG PO TABS   Oral   Take 100 mg by mouth 2 (two) times daily.         Marland Kitchen VITAMIN D 1000 UNITS PO TABS   Oral   Take 5,000 Units by mouth daily.         Marland Kitchen DILTIAZEM HCL ER 90 MG PO CP12   Oral   Take 180 mg by mouth 2 (two) times daily.         . ENALAPRIL MALEATE 20 MG PO TABS   Oral   Take 20 mg by mouth 2 (two) times daily.         . FENOFIBRATE MICRONIZED 134 MG PO CAPS   Oral   Take 134 mg by mouth daily before breakfast.         . HYDROCHLOROTHIAZIDE 12.5 MG PO CAPS   Oral   Take 12.5 mg by mouth daily.         Marland Kitchen LEVOTHYROXINE SODIUM 75 MCG PO TABS   Oral   Take 75 mcg  by mouth daily.         . ADULT MULTIVITAMIN W/MINERALS CH   Oral   Take 1 tablet by mouth daily.         Marland Kitchen PRAVASTATIN SODIUM 20 MG PO TABS   Oral   Take 10 mg by mouth every other day. Monday Wednesday Friday         . VITAMIN B-12 1000 MCG PO TABS   Oral   Take 1,000 mcg by mouth daily.         . WARFARIN SODIUM 2.5 MG PO TABS   Oral   Take 2.5 mg by mouth daily.           BP 174/61  Pulse 77  Temp 98.3 F (36.8 C) (Oral)  Resp 18  SpO2 95%  Physical Exam CONSTITUTIONAL: Well developed/well nourished HEAD AND FACE: Normocephalic/atraumatic EYES: EOMI/PERRL ENMT: Mucous membranes moist NECK: supple no meningeal signs CV: S1/S2 noted, no murmurs/rubs/gallops noted LUNGS: Lungs are clear to auscultation bilaterally, no apparent distress ABDOMEN: soft, nontender, no rebound or guarding GU:no cva tenderness NEURO: Pt is awake/alert, moves all extremitiesx4 EXTREMITIES: pulses normal/equal in both feet, full ROM Tenderness to palpation of left calf and on lower aspect of left LE.  Left achilles intact.  No  left ankle or foot tenderness.  There is no left knee tenderness.  No erythema or significant edema.  No bruising noted.  She can range both left knee/ankle SKIN: warm, color normal PSYCH: no abnormalities of mood noted  ED Course  Procedures    Labs Reviewed  BASIC METABOLIC PANEL  CBC WITH DIFFERENTIAL  D-DIMER, QUANTITATIVE  PROTIME-INR   7:44 PM Pt with continued pain in left LE.  She requests xray of leg and evaluation for DVT Will start with d-dimer Pt currently stable 8:59 PM ddimer elevated but already on coumadin.  Will need outpatient DVT study for further eval.  Stable for d/c.  She requests crutches at discharge to help with ambulation MDM  Nursing notes including past medical history and social history reviewed and considered in documentation Labs/vital reviewed and considered xrays reviewed and considered Discussed xray with radiologist dr Vern Claude, MD 04/29/12 2100

## 2012-04-29 NOTE — ED Notes (Signed)
PT continues to feel nauseous. Pt laying back in bed for comfort measures. Waiting on ride.

## 2012-04-29 NOTE — ED Notes (Signed)
Pt c/o left leg pain x 5 days; pt denies obvious injury; pt sts saw PCP yesterday and thought it could be bursitis;

## 2012-04-30 ENCOUNTER — Ambulatory Visit (HOSPITAL_COMMUNITY)
Admission: RE | Admit: 2012-04-30 | Discharge: 2012-04-30 | Disposition: A | Discharge status: Home / Self Care | Payer: Medicare Other | Source: Ambulatory Visit | Attending: Emergency Medicine | Admitting: Emergency Medicine

## 2012-04-30 DIAGNOSIS — M79609 Pain in unspecified limb: Secondary | ICD-10-CM | POA: Insufficient documentation

## 2012-04-30 DIAGNOSIS — Z950 Presence of cardiac pacemaker: Secondary | ICD-10-CM | POA: Insufficient documentation

## 2012-04-30 DIAGNOSIS — M7989 Other specified soft tissue disorders: Secondary | ICD-10-CM | POA: Insufficient documentation

## 2012-04-30 NOTE — ED Notes (Signed)
Vascular called to let us know that DVT study was negative.Patient was informed to follow up with PCP.

## 2012-05-07 ENCOUNTER — Ambulatory Visit: Payer: Medicare Other

## 2012-05-15 ENCOUNTER — Other Ambulatory Visit: Payer: Self-pay | Admitting: Internal Medicine

## 2012-05-15 DIAGNOSIS — R52 Pain, unspecified: Secondary | ICD-10-CM

## 2012-05-16 ENCOUNTER — Ambulatory Visit
Admission: RE | Admit: 2012-05-16 | Discharge: 2012-05-16 | Disposition: A | Discharge status: Home / Self Care | Payer: Medicare Other | Source: Ambulatory Visit | Attending: Internal Medicine | Admitting: Internal Medicine

## 2012-05-16 DIAGNOSIS — R52 Pain, unspecified: Secondary | ICD-10-CM

## 2012-06-03 ENCOUNTER — Other Ambulatory Visit (HOSPITAL_COMMUNITY): Payer: Self-pay | Admitting: Internal Medicine

## 2012-06-03 ENCOUNTER — Ambulatory Visit (HOSPITAL_COMMUNITY)
Admission: RE | Admit: 2012-06-03 | Discharge: 2012-06-03 | Disposition: A | Discharge status: Home / Self Care | Payer: Medicare Other | Source: Ambulatory Visit | Attending: Internal Medicine | Admitting: Internal Medicine

## 2012-06-03 DIAGNOSIS — I10 Essential (primary) hypertension: Secondary | ICD-10-CM

## 2012-06-03 DIAGNOSIS — I517 Cardiomegaly: Secondary | ICD-10-CM | POA: Insufficient documentation

## 2012-06-03 DIAGNOSIS — M949 Disorder of cartilage, unspecified: Secondary | ICD-10-CM | POA: Insufficient documentation

## 2012-06-03 DIAGNOSIS — M899 Disorder of bone, unspecified: Secondary | ICD-10-CM | POA: Insufficient documentation

## 2012-06-03 DIAGNOSIS — Z13820 Encounter for screening for osteoporosis: Secondary | ICD-10-CM | POA: Insufficient documentation

## 2012-06-03 DIAGNOSIS — Z87891 Personal history of nicotine dependence: Secondary | ICD-10-CM | POA: Insufficient documentation

## 2012-06-10 ENCOUNTER — Ambulatory Visit: Payer: Self-pay | Admitting: Cardiovascular Disease

## 2012-06-10 DIAGNOSIS — Z7901 Long term (current) use of anticoagulants: Secondary | ICD-10-CM

## 2012-06-10 DIAGNOSIS — I4891 Unspecified atrial fibrillation: Secondary | ICD-10-CM

## 2012-07-10 ENCOUNTER — Ambulatory Visit: Payer: Medicare Other

## 2012-07-16 ENCOUNTER — Other Ambulatory Visit (HOSPITAL_COMMUNITY): Payer: Self-pay | Admitting: Cardiovascular Disease

## 2012-07-16 DIAGNOSIS — I701 Atherosclerosis of renal artery: Secondary | ICD-10-CM

## 2012-07-29 ENCOUNTER — Ambulatory Visit (HOSPITAL_COMMUNITY)
Admission: RE | Admit: 2012-07-29 | Discharge: 2012-07-29 | Disposition: A | Discharge status: Home / Self Care | Payer: Medicare Other | Source: Ambulatory Visit | Attending: Cardiovascular Disease | Admitting: Cardiovascular Disease

## 2012-07-29 DIAGNOSIS — I701 Atherosclerosis of renal artery: Secondary | ICD-10-CM | POA: Insufficient documentation

## 2012-07-30 NOTE — Progress Notes (Signed)
Renal Duplex Completed. Deanna Poole D  

## 2012-08-12 ENCOUNTER — Telehealth: Payer: Self-pay | Admitting: *Deleted

## 2012-08-12 ENCOUNTER — Ambulatory Visit
Admission: RE | Admit: 2012-08-12 | Discharge: 2012-08-12 | Disposition: A | Discharge status: Home / Self Care | Payer: Medicare Other | Source: Ambulatory Visit | Attending: Internal Medicine | Admitting: Internal Medicine

## 2012-08-12 DIAGNOSIS — Z1231 Encounter for screening mammogram for malignant neoplasm of breast: Secondary | ICD-10-CM

## 2012-08-12 DIAGNOSIS — I701 Atherosclerosis of renal artery: Secondary | ICD-10-CM

## 2012-08-12 NOTE — Telephone Encounter (Signed)
Message copied by Marella Bile on Tue Aug 12, 2012  2:21 PM ------      Message from: Runell Gess      Created: Wed Aug 06, 2012  7:36 PM       No changes noted in renal dopplers. Repeat in 12 months ------

## 2012-08-27 ENCOUNTER — Ambulatory Visit (INDEPENDENT_AMBULATORY_CARE_PROVIDER_SITE_OTHER): Payer: Medicare Other | Admitting: Pharmacist Clinician (PhC)/ Clinical Pharmacy Specialist

## 2012-08-27 VITALS — BP 142/72 | HR 80

## 2012-08-27 DIAGNOSIS — I4891 Unspecified atrial fibrillation: Secondary | ICD-10-CM

## 2012-08-27 DIAGNOSIS — Z7901 Long term (current) use of anticoagulants: Secondary | ICD-10-CM

## 2012-08-27 LAB — POCT INR: INR: 2.1

## 2012-10-05 ENCOUNTER — Other Ambulatory Visit: Payer: Self-pay | Admitting: Cardiovascular Disease

## 2012-10-05 DIAGNOSIS — I495 Sick sinus syndrome: Secondary | ICD-10-CM

## 2012-10-08 ENCOUNTER — Ambulatory Visit (INDEPENDENT_AMBULATORY_CARE_PROVIDER_SITE_OTHER): Payer: Medicare Other | Admitting: Pharmacist Clinician (PhC)/ Clinical Pharmacy Specialist

## 2012-10-08 ENCOUNTER — Ambulatory Visit: Payer: Medicare Other | Admitting: Pharmacist Clinician (PhC)/ Clinical Pharmacy Specialist

## 2012-10-08 VITALS — BP 146/62 | HR 92

## 2012-10-08 DIAGNOSIS — Z7901 Long term (current) use of anticoagulants: Secondary | ICD-10-CM

## 2012-10-08 DIAGNOSIS — I4891 Unspecified atrial fibrillation: Secondary | ICD-10-CM

## 2012-10-08 LAB — POCT INR: INR: 2.5

## 2012-10-21 ENCOUNTER — Encounter: Payer: Self-pay | Admitting: *Deleted

## 2012-10-21 LAB — REMOTE PACEMAKER DEVICE
AL AMPLITUDE: 2.1 mv
AL IMPEDENCE PM: 424 Ohm
BATTERY VOLTAGE: 2.92 V
RV LEAD AMPLITUDE: 15.7 mv
VENTRICULAR PACING PM: 0.3

## 2012-11-19 ENCOUNTER — Ambulatory Visit (INDEPENDENT_AMBULATORY_CARE_PROVIDER_SITE_OTHER): Payer: Medicare Other | Admitting: Pharmacist Clinician (PhC)/ Clinical Pharmacy Specialist

## 2012-11-19 VITALS — BP 160/80 | HR 64

## 2012-11-19 DIAGNOSIS — Z7901 Long term (current) use of anticoagulants: Secondary | ICD-10-CM

## 2012-11-19 DIAGNOSIS — I4891 Unspecified atrial fibrillation: Secondary | ICD-10-CM

## 2012-12-25 ENCOUNTER — Encounter: Payer: Self-pay | Admitting: *Deleted

## 2012-12-31 ENCOUNTER — Ambulatory Visit (INDEPENDENT_AMBULATORY_CARE_PROVIDER_SITE_OTHER): Payer: Medicare Other | Admitting: Pharmacist Clinician (PhC)/ Clinical Pharmacy Specialist

## 2012-12-31 ENCOUNTER — Ambulatory Visit: Payer: Medicare Other | Admitting: Pharmacist Clinician (PhC)/ Clinical Pharmacy Specialist

## 2012-12-31 VITALS — BP 132/70 | HR 72

## 2012-12-31 DIAGNOSIS — I4891 Unspecified atrial fibrillation: Secondary | ICD-10-CM

## 2012-12-31 DIAGNOSIS — Z7901 Long term (current) use of anticoagulants: Secondary | ICD-10-CM

## 2012-12-31 LAB — POCT INR: INR: 2.2

## 2013-01-06 ENCOUNTER — Encounter: Payer: Self-pay | Admitting: *Deleted

## 2013-01-06 ENCOUNTER — Ambulatory Visit (INDEPENDENT_AMBULATORY_CARE_PROVIDER_SITE_OTHER): Payer: Medicare Other | Admitting: *Deleted

## 2013-01-06 DIAGNOSIS — I4891 Unspecified atrial fibrillation: Secondary | ICD-10-CM

## 2013-01-06 LAB — REMOTE PACEMAKER DEVICE
AL AMPLITUDE: 2 mv
BATTERY VOLTAGE: 2.9 V
RV LEAD AMPLITUDE: 15.4 mv

## 2013-01-06 LAB — PACEMAKER DEVICE OBSERVATION

## 2013-01-09 ENCOUNTER — Encounter: Payer: Self-pay | Admitting: Cardiovascular Disease

## 2013-02-11 ENCOUNTER — Ambulatory Visit (INDEPENDENT_AMBULATORY_CARE_PROVIDER_SITE_OTHER): Payer: Medicare Other | Admitting: Pharmacist Clinician (PhC)/ Clinical Pharmacy Specialist

## 2013-02-11 VITALS — BP 180/80 | HR 96

## 2013-02-11 DIAGNOSIS — I4891 Unspecified atrial fibrillation: Secondary | ICD-10-CM

## 2013-02-11 DIAGNOSIS — Z7901 Long term (current) use of anticoagulants: Secondary | ICD-10-CM

## 2013-02-20 ENCOUNTER — Other Ambulatory Visit: Payer: Self-pay | Admitting: Internal Medicine

## 2013-02-26 ENCOUNTER — Encounter: Payer: Self-pay | Admitting: *Deleted

## 2013-02-27 ENCOUNTER — Encounter: Payer: Self-pay | Admitting: Cardiovascular Disease

## 2013-02-27 ENCOUNTER — Ambulatory Visit (INDEPENDENT_AMBULATORY_CARE_PROVIDER_SITE_OTHER): Payer: Medicare Other | Admitting: Cardiovascular Disease

## 2013-02-27 VITALS — BP 150/60 | HR 73 | Ht 66.0 in | Wt 155.0 lb

## 2013-02-27 DIAGNOSIS — Z950 Presence of cardiac pacemaker: Secondary | ICD-10-CM

## 2013-02-27 DIAGNOSIS — I4891 Unspecified atrial fibrillation: Secondary | ICD-10-CM

## 2013-02-27 NOTE — Assessment & Plan Note (Signed)
Currently in sinus rhythm atrially paced. She is on Coumadin anticoagulation.

## 2013-02-27 NOTE — Patient Instructions (Signed)
Follow up with Dr Berry as needed.  

## 2013-02-27 NOTE — Assessment & Plan Note (Signed)
Chamber pacemaker placed initially by Dr. Lenise Herald 07/23/2005. This has been followed by Dr. Royann Shivers who last saw the patient 04/28/12. She gets remote interrogation quarterly.

## 2013-02-27 NOTE — Progress Notes (Signed)
02/27/2013 Deanna Poole   09-Oct-1940  [id number]  Primary Physician MCKEOWN,WILLIAM DAVID, MD Primary Cardiologist: Runell Gess MD [name]   HPI:  The patient is a 72 year old thin appearing widowed Caucasian female mother of 4, grandmother to 4 grandchildren who I saw a year ago. She has PAF, sick sinus syndrome status post permanent transvenous pacemaker implantation in 2007. She has noncritical CAD by cath performed by Dr. Lenise Herald July 23, 2005. She also has apical hypertrophy and nonsustained ventricular tachycardia with normal LV systolic function. Her pacer is followed by Dr. Royann Shivers. Her other problems include hypertension and hyperlipidemia. She is otherwise asymptomatic.     Current Outpatient Prescriptions  Medication Sig Dispense Refill  . alendronate (FOSAMAX) 70 MG tablet Take 70 mg by mouth once a week. Take with a full glass of water on an empty stomach.      Marland Kitchen aspirin 81 MG chewable tablet Chew 81 mg by mouth daily.      Marland Kitchen atenolol (TENORMIN) 100 MG tablet Take 100 mg by mouth 2 (two) times daily.      . cholecalciferol (VITAMIN D) 1000 UNITS tablet Take 5,000 Units by mouth daily.      Marland Kitchen diltiazem (CARDIZEM CD) 180 MG 24 hr capsule Take 180 mg by mouth 2 (two) times daily.       . enalapril (VASOTEC) 20 MG tablet Take 20 mg by mouth 2 (two) times daily.      . fenofibrate micronized (LOFIBRA) 134 MG capsule Take 134 mg by mouth daily before breakfast.      . hydrochlorothiazide (MICROZIDE) 12.5 MG capsule Take 12.5 mg by mouth daily.      Marland Kitchen levothyroxine (SYNTHROID, LEVOTHROID) 75 MCG tablet take 1 tablet by mouth once daily  90 tablet  12  . Multiple Vitamin (MULTIVITAMIN WITH MINERALS) TABS Take 1 tablet by mouth daily.      . pravastatin (PRAVACHOL) 20 MG tablet Take 10 mg by mouth every other day. Monday Wednesday Friday      . warfarin (COUMADIN) 2.5 MG tablet Take 2.5 mg by mouth daily.       No current facility-administered  medications for this visit.    No Known Allergies  History   Social History  . Marital Status: Widowed    Spouse Name: N/A    Number of Children: N/A  . Years of Education: N/A   Occupational History  . Not on file.   Social History Main Topics  . Smoking status: Former Smoker    Quit date: 12/25/2011  . Smokeless tobacco: Not on file  . Alcohol Use: No  . Drug Use: No  . Sexual Activity: Not on file   Other Topics Concern  . Not on file   Social History Narrative  . No narrative on file     Review of Systems: General: negative for chills, fever, night sweats or weight changes.  Cardiovascular: negative for chest pain, dyspnea on exertion, edema, orthopnea, palpitations, paroxysmal nocturnal dyspnea or shortness of breath Dermatological: negative for rash Respiratory: negative for cough or wheezing Urologic: negative for hematuria Abdominal: negative for nausea, vomiting, diarrhea, bright red blood per rectum, melena, or hematemesis Neurologic: negative for visual changes, syncope, or dizziness All other systems reviewed and are otherwise negative except as noted above.    Blood pressure 150/60, pulse 73, height 5\' 6"  (1.676 m), weight 155 lb (70.308 kg).  General appearance: alert and no distress Neck: no adenopathy, no carotid  bruit, no JVD, supple, symmetrical, trachea midline and thyroid not enlarged, symmetric, no tenderness/mass/nodules Lungs: clear to auscultation bilaterally Heart: regular rate and rhythm, S1, S2 normal, no murmur, click, rub or gallop Extremities: extremities normal, atraumatic, no cyanosis or edema  EKG atrially paced rhythm  ASSESSMENT AND PLAN:   ATRIAL FIBRILLATION Currently in sinus rhythm atrially paced. She is on Coumadin anticoagulation.  Pacemaker Chamber pacemaker placed initially by Dr. Lenise Herald 07/23/2005. This has been followed by Dr. Royann Shivers who last saw the patient 04/28/12. She gets remote interrogation  quarterly.      Runell Gess MD FACP,FACC,FAHA, [facility] 02/27/2013 12:52 PM

## 2013-03-15 ENCOUNTER — Encounter: Payer: Self-pay | Admitting: Internal Medicine

## 2013-03-16 ENCOUNTER — Encounter: Payer: Self-pay | Admitting: Emergency Medicine

## 2013-03-16 ENCOUNTER — Ambulatory Visit (INDEPENDENT_AMBULATORY_CARE_PROVIDER_SITE_OTHER): Payer: Medicare Other | Admitting: Pharmacist Clinician (PhC)/ Clinical Pharmacy Specialist

## 2013-03-16 ENCOUNTER — Ambulatory Visit (INDEPENDENT_AMBULATORY_CARE_PROVIDER_SITE_OTHER): Payer: Medicare Other | Admitting: Emergency Medicine

## 2013-03-16 VITALS — BP 120/68 | HR 76

## 2013-03-16 VITALS — BP 124/62 | HR 86 | Temp 98.2°F | Resp 16 | Wt 151.0 lb

## 2013-03-16 DIAGNOSIS — Z7901 Long term (current) use of anticoagulants: Secondary | ICD-10-CM

## 2013-03-16 DIAGNOSIS — E559 Vitamin D deficiency, unspecified: Secondary | ICD-10-CM | POA: Insufficient documentation

## 2013-03-16 DIAGNOSIS — E785 Hyperlipidemia, unspecified: Secondary | ICD-10-CM | POA: Insufficient documentation

## 2013-03-16 DIAGNOSIS — I4891 Unspecified atrial fibrillation: Secondary | ICD-10-CM

## 2013-03-16 DIAGNOSIS — I10 Essential (primary) hypertension: Secondary | ICD-10-CM

## 2013-03-16 DIAGNOSIS — E119 Type 2 diabetes mellitus without complications: Secondary | ICD-10-CM

## 2013-03-16 LAB — CBC WITH DIFFERENTIAL/PLATELET
Basophils Relative: 0 % (ref 0–1)
Eosinophils Absolute: 0.2 10*3/uL (ref 0.0–0.7)
Eosinophils Relative: 3 % (ref 0–5)
HCT: 41.2 % (ref 36.0–46.0)
Hemoglobin: 14 g/dL (ref 12.0–15.0)
MCH: 29.3 pg (ref 26.0–34.0)
MCHC: 34 g/dL (ref 30.0–36.0)
MCV: 86.2 fL (ref 78.0–100.0)
Monocytes Absolute: 0.7 10*3/uL (ref 0.1–1.0)
Monocytes Relative: 13 % — ABNORMAL HIGH (ref 3–12)
Neutro Abs: 2.4 10*3/uL (ref 1.7–7.7)
Platelets: 222 10*3/uL (ref 150–400)

## 2013-03-16 LAB — BASIC METABOLIC PANEL WITH GFR
BUN: 30 mg/dL — ABNORMAL HIGH (ref 6–23)
Calcium: 10.3 mg/dL (ref 8.4–10.5)
Creat: 0.8 mg/dL (ref 0.50–1.10)
GFR, Est African American: 85 mL/min
Glucose, Bld: 83 mg/dL (ref 70–99)
Potassium: 4.9 mEq/L (ref 3.5–5.3)

## 2013-03-16 LAB — LIPID PANEL
Cholesterol: 147 mg/dL (ref 0–200)
HDL: 36 mg/dL — ABNORMAL LOW (ref >39)
Total CHOL/HDL Ratio: 4.1 Ratio
Triglycerides: 219 mg/dL — ABNORMAL HIGH (ref <150)

## 2013-03-16 LAB — POCT INR: INR: 2.1

## 2013-03-16 LAB — HEPATIC FUNCTION PANEL
AST: 30 U/L (ref 0–37)
Albumin: 4.5 g/dL (ref 3.5–5.2)
Bilirubin, Direct: 0.1 mg/dL (ref 0.0–0.3)
Total Bilirubin: 0.4 mg/dL (ref 0.3–1.2)
Total Protein: 7 g/dL (ref 6.0–8.3)

## 2013-03-16 LAB — HEMOGLOBIN A1C: Mean Plasma Glucose: 126 mg/dL — ABNORMAL HIGH (ref <117)

## 2013-03-16 NOTE — Patient Instructions (Signed)

## 2013-03-16 NOTE — Progress Notes (Signed)
Subjective:    Patient ID: Deanna Poole, female    DOB: 05/28/1940, 72 y.o.   MRN: [id number]  HPI Comments: 72 yo pleasant female  presents for 3 month F/U for HTN, Cholesterol, DM, D. Deficient. Last labs BS 103 T 138 TG 347 H 27L 42 A1C 5.9 INSULIN 142 MAG 1.4.  Unable to tolerate Metformin and magnesium due to diarrhea.  BS 100  AT HOME.  BP good at home. Walking more for exercise. Eating a little better. She notes she is living with daughter in [employer] [place] and doing well overall with mood improved and no other concerns.  Chronic allergy drainage but not production of color or congestion.   [facility] to [facility] coverage. She is going to establish with new Derm in [place].  Hyperlipidemia  Hypertension  Atrial Fibrillation Symptoms include hypertension. Past medical history includes atrial fibrillation and hyperlipidemia.  Diabetes    Current Outpatient Prescriptions on File Prior to Visit  Medication Sig Dispense Refill  . alendronate (FOSAMAX) 70 MG tablet Take 70 mg by mouth once a week. Take with a full glass of water on an empty stomach.      Marland Kitchen aspirin 81 MG chewable tablet Chew 81 mg by mouth daily.      Marland Kitchen atenolol (TENORMIN) 100 MG tablet Take 100 mg by mouth 2 (two) times daily.      . cholecalciferol (VITAMIN D) 1000 UNITS tablet Take 5,000 Units by mouth daily.      Marland Kitchen diltiazem (CARDIZEM CD) 180 MG 24 hr capsule Take 180 mg by mouth 2 (two) times daily.       . enalapril (VASOTEC) 20 MG tablet Take 20 mg by mouth 2 (two) times daily.      . fenofibrate micronized (LOFIBRA) 134 MG capsule Take 134 mg by mouth daily before breakfast.      . hydrochlorothiazide (MICROZIDE) 12.5 MG capsule Take 12.5 mg by mouth daily.      Marland Kitchen levothyroxine (SYNTHROID, LEVOTHROID) 75 MCG tablet take 1 tablet by mouth once daily  90 tablet  12  . Multiple Vitamin (MULTIVITAMIN WITH MINERALS) TABS Take 1 tablet by mouth daily.      .  pravastatin (PRAVACHOL) 20 MG tablet Take 10 mg by mouth every other day. Monday Wednesday Friday      . warfarin (COUMADIN) 2.5 MG tablet Take 2.5 mg by mouth daily.       No current facility-administered medications on file prior to visit.   ALLERGIES Caffeine; Chocolate; Flax seed; Adhesive; Ivp dye; and Tricor  Past Medical History  Diagnosis Date  . Coronary artery disease   . Hypertension   . Thyroid disease   . 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 PAF (paroxysmal atrial fibrillation)   . Hypertrophic cardiomyopathy     apical-variant  . Hyperlipidemia   . COPD (chronic obstructive pulmonary disease)   . Tobacco abuse     quit 12/2011  . History of nuclear stress test 12/2010    lexiscan; normal pattern of perfusion; post-stress EF 52%; normal, low risk study   . Renal artery stenosis     right renal artery stent  . Unspecified vitamin D deficiency      Review of Systems  HENT: Positive for postnasal drip.   All other systems reviewed and are negative.   BP 124/62  Pulse 86  Temp(Src) 98.2 F (36.8  C) (Temporal)  Resp 16  Wt 151 lb (68.493 kg)     Objective:   Physical Exam  Nursing note and vitals reviewed. Constitutional: She is oriented to person, place, and time. She appears well-developed and well-nourished. No distress.  HENT:  Head: Normocephalic and atraumatic.  Right Ear: External ear normal.  Left Ear: External ear normal.  Nose: Nose normal.  Mouth/Throat: Oropharynx is clear and moist. No oropharyngeal exudate.  Eyes: Conjunctivae and EOM are normal.  Neck: Normal range of motion. Neck supple. No JVD present. No thyromegaly present.  Cardiovascular: Normal rate, regular rhythm, normal heart sounds and intact distal pulses.   Pulmonary/Chest: Effort normal and breath sounds normal.  Abdominal: Soft. Bowel sounds are normal. She exhibits no distension and no mass. There is no tenderness. There is  no rebound and no guarding.  Musculoskeletal: Normal range of motion. She exhibits no edema and no tenderness.  Lymphadenopathy:    She has no cervical adenopathy.  Neurological: She is alert and oriented to person, place, and time. No cranial nerve deficit.  Skin: Skin is warm and dry. No rash noted. There is erythema. No pallor.  Left temple erythematous raised 5-30mm with scale  Psychiatric: She has a normal mood and affect. Her behavior is normal. Judgment and thought content normal.          Assessment & Plan:  1.  3 month F/U for HTN, Cholesterol,DM, D. Deficient. Needs healthy diet, cardio QD and obtain healthy weight. Check Labs, Check BP if >130/80 call office, Check BS if >200 call office 2.Allergic rhinitis- Allegra OTC, increase H2o, allergy hygiene explained. 3. 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

## 2013-04-14 ENCOUNTER — Other Ambulatory Visit: Payer: Self-pay | Admitting: Internal Medicine

## 2013-05-06 ENCOUNTER — Ambulatory Visit (INDEPENDENT_AMBULATORY_CARE_PROVIDER_SITE_OTHER): Payer: Medicare PPO | Admitting: Pharmacist Clinician (PhC)/ Clinical Pharmacy Specialist

## 2013-05-06 ENCOUNTER — Encounter: Payer: Self-pay | Admitting: Cardiovascular Disease

## 2013-05-06 ENCOUNTER — Ambulatory Visit (INDEPENDENT_AMBULATORY_CARE_PROVIDER_SITE_OTHER): Payer: Medicare PPO | Admitting: Cardiovascular Disease

## 2013-05-06 VITALS — BP 138/66 | HR 72 | Resp 16 | Ht 66.0 in | Wt 153.2 lb

## 2013-05-06 DIAGNOSIS — I4891 Unspecified atrial fibrillation: Secondary | ICD-10-CM

## 2013-05-06 DIAGNOSIS — Z7901 Long term (current) use of anticoagulants: Secondary | ICD-10-CM

## 2013-05-06 DIAGNOSIS — Z950 Presence of cardiac pacemaker: Secondary | ICD-10-CM

## 2013-05-06 LAB — MDC_IDC_ENUM_SESS_TYPE_INCLINIC
Battery Voltage: 2.9 V
Brady Statistic AP VP Percent: 0.38 %
Brady Statistic AS VS Percent: 0.3 %
Brady Statistic RV Percent Paced: 0.97 %
Date Time Interrogation Session: 20150211171057
Lead Channel Impedance Value: 432 Ohm
Lead Channel Pacing Threshold Amplitude: 1 V
Lead Channel Setting Pacing Amplitude: 2 V
Lead Channel Setting Sensing Sensitivity: 0.9 mV
MDC IDC MSMT LEADCHNL RA PACING THRESHOLD AMPLITUDE: 1 V
MDC IDC MSMT LEADCHNL RA PACING THRESHOLD PULSEWIDTH: 0.4 ms
MDC IDC MSMT LEADCHNL RV IMPEDANCE VALUE: 648 Ohm
MDC IDC MSMT LEADCHNL RV PACING THRESHOLD PULSEWIDTH: 0.4 ms
MDC IDC MSMT LEADCHNL RV SENSING INTR AMPL: 15.3971
MDC IDC SET LEADCHNL RV PACING AMPLITUDE: 2.5 V
MDC IDC SET LEADCHNL RV PACING PULSEWIDTH: 0.4 ms
MDC IDC STAT BRADY AP VS PERCENT: 98.73 %
MDC IDC STAT BRADY AS VP PERCENT: 0.59 %
MDC IDC STAT BRADY RA PERCENT PACED: 99.1 %
Zone Setting Detection Interval: 350 ms
Zone Setting Detection Interval: 400 ms

## 2013-05-06 LAB — PACEMAKER DEVICE OBSERVATION

## 2013-05-06 LAB — POCT INR: INR: 2.1

## 2013-05-06 NOTE — Patient Instructions (Signed)
Remote monitoring is used to monitor your pacemaker from home. This monitoring reduces the number of office visits required to check your device to one time per year. It allows us to keep an eye on the functioning of your device to ensure it is working properly. You are scheduled for a device check from home on 08-10-2013. You may send your transmission at any time that day. If you have a wireless device, the transmission will be sent automatically. After your physician reviews your transmission, you will receive a postcard with your next transmission date.  Your physician recommends that you schedule a follow-up appointment as needed.

## 2013-05-08 ENCOUNTER — Encounter: Payer: Self-pay | Admitting: Internal Medicine

## 2013-05-13 NOTE — Progress Notes (Signed)
Patient ID: Deanna Poole, female   DOB: 02/03/1941, 73 y.o.   MRN: [id number]      Reason for office visit Pacemaker followup, paroxysmal atrial fibrillation, apical variant hypertrophic cardiomyopathy, nonsustained VT  Deanna Poole is now 73 years old and has apical variant cardiomyopathy complicated by paroxysmal atrial fibrillation and occasional asymptomatic nonsustained VT. 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 [place] in the [place] of [place] medical care there.   She is on warfarin anticoagulation without any bleeding complications or known embolic events. She is essentially asymptomatic. Very rarely she complains of lightheadedness which she attributes to dehydration.  Irrigation of her pacemaker shows normal findings. No episodes of atrial fibrillation since her last device check. The last atrial fibrillation was recorded in October. She has had 2 episodes of nonsustained VT with a rate of 160 beats per minute lasting for almost 25 beats. This is not much different from previous cervical times. None of the episodes were symptomatic. Denies bleeding complications or focal neurological events.   Allergies  Allergen Reactions  . Caffeine Other (See Comments)    Heart flutters  . Chocolate Diarrhea  . Flax Seed [Bio-Flax]   . Magnesium-Containing Compounds   . Adhesive [Tape] Rash  . Ivp Dye [Iodinated Diagnostic Agents] Rash  . Tricor [Fenofibrate] Rash    Current Outpatient Prescriptions  Medication Sig Dispense Refill  . alendronate (FOSAMAX) 70 MG tablet Take 70 mg by mouth once a week. Take with a full glass of water on an empty stomach.      Marland Kitchen. aspirin 81 MG chewable tablet Chew 81 mg by mouth daily.      Marland Kitchen. atenolol (TENORMIN) 100 MG tablet TAKE 1 TABLET BY MOUTH  TWICE DAILY FOR BLOOD PRESSURE AND HEART  180 tablet  0  . cholecalciferol (VITAMIN D) 1000 UNITS  tablet Take 5,000 Units by mouth daily.      Marland Kitchen. diltiazem (CARDIZEM CD) 180 MG 24 hr capsule Take 180 mg by mouth 2 (two) times daily.       . enalapril (VASOTEC) 20 MG tablet Take 20 mg by mouth 2 (two) times daily.      . fenofibrate micronized (LOFIBRA) 134 MG capsule Take 134 mg by mouth daily before breakfast.      . hydrochlorothiazide (MICROZIDE) 12.5 MG capsule Take 12.5 mg by mouth daily.      Marland Kitchen. levothyroxine (SYNTHROID, LEVOTHROID) 75 MCG tablet take 1 tablet by mouth once daily  90 tablet  12  . Multiple Vitamin (MULTIVITAMIN WITH MINERALS) TABS Take 1 tablet by mouth daily.      . pravastatin (PRAVACHOL) 20 MG tablet Take 10 mg by mouth every other day. Monday Wednesday Friday      . warfarin (COUMADIN) 2.5 MG tablet Take 2.5 mg by mouth daily.       No current facility-administered medications for this visit.    Past Medical History  Diagnosis Date  . Coronary artery disease   . Hypertension   . Thyroid disease   . 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 PAF (paroxysmal atrial fibrillation)   . Hypertrophic cardiomyopathy     apical-variant  . Hyperlipidemia   . COPD (chronic obstructive pulmonary disease)   . Tobacco abuse     quit 12/2011  . History of nuclear stress test 12/2010    lexiscan; normal pattern of perfusion; post-stress EF  52%; normal, low risk study   . Renal artery stenosis     right renal artery stent  . Unspecified vitamin D deficiency     Past Surgical History  Procedure Laterality Date  . Pacemaker insertion  07/2005    dual-chamber  . Cardiac catheterization  07/23/2005    non-critical CAD (Dr. Laurell Josephs)  . Renal artery angioplasty Right 10/20/1998    21mmx1.5cm PoweFlex balloon dilated up to 8 atmospheres, post-dilated with 48mmx1.5cm PowerFlex up to 8 atmospheres, 80% stenosis to 0%, renal artery stent (Dr. Erlene Quan)  . Breast biopsy  1990s  . Tubal ligation  1969  . Transthoracic echocardiogram   12/2010    EF >70%, severe asymmetric LVH, mid-cavitary gradient is present, evidence of apical entrapment & limiated apical infarction (hypertrophic cardiomyopathy); normal RVSP; LA mod dilated; mild MR; trace TR; trace PV regurg   . Renal artery doppler  5/08/28/2012    distal abdominal aorta 0-49% diameter reduction; R renal artery stent =/>60% diameter reduction; L renal artery 1-59% diameter reduction     Family History  Problem Relation Age of Onset  . Heart disease Mother   . Lung disease Mother   . Hypertension Mother   . Hypertension Father   . Cancer Sister     x2  . Hypertension Sister     x4  . Lung disease Sister   . Hypertension Child     x3  . Heart murmur Child   . Diabetes Child     History   Social History  . Marital Status: Widowed    Spouse Name: N/A    Number of Children: N/A  . Years of Education: N/A   Occupational History  . Not on file.   Social History Main Topics  . Smoking status: Current Some Day Smoker -- 0.25 packs/day    Types: Cigarettes  . Smokeless tobacco: Never Used  . Alcohol Use: No  . Drug Use: No  . Sexual Activity: Not on file   Other Topics Concern  . Not on file   Social History Narrative  . No narrative on file    Review of systems: The patient specifically denies any chest pain at rest or with exertion, dyspnea at rest or with exertion, orthopnea, paroxysmal nocturnal dyspnea, syncope, palpitations, focal neurological deficits, intermittent claudication, lower extremity edema, unexplained weight gain, cough, hemoptysis or wheezing.  The patient also denies abdominal pain, nausea, vomiting, dysphagia, diarrhea, constipation, polyuria, polydipsia, dysuria, hematuria, frequency, urgency, abnormal bleeding or bruising, fever, chills, unexpected weight changes, mood swings, change in skin or hair texture, change in voice quality, auditory or visual problems, allergic reactions or rashes, new musculoskeletal complaints other  than usual "aches and pains".   PHYSICAL EXAM BP 138/66  Pulse 72  Resp 16  Ht 5\' 6"  (1.676 m)  Wt 69.491 kg (153 lb 3.2 oz)  BMI 24.74 kg/m2  General: Alert, oriented x3, no distress Head: no evidence of trauma, PERRL, EOMI, no exophtalmos or lid lag, no myxedema, no xanthelasma; normal ears, nose and oropharynx Neck: normal jugular venous pulsations and no hepatojugular reflux; brisk carotid pulses without delay and no carotid bruits Chest: clear to auscultation, no signs of consolidation by percussion or palpation, normal fremitus, symmetrical and full respiratory excursions Cardiovascular: normal position and quality of the apical impulse, regular rhythm, normal first and second heart sounds, no murmurs, rubs or gallops Abdomen: no tenderness or distention, no masses by palpation, no abnormal pulsatility or arterial bruits, normal bowel  sounds, no hepatosplenomegaly Extremities: no clubbing, cyanosis or edema; 2+ radial, ulnar and brachial pulses bilaterally; 2+ right femoral, posterior tibial and dorsalis pedis pulses; 2+ left femoral, posterior tibial and dorsalis pedis pulses; no subclavian or femoral bruits Neurological: grossly nonfocal   EKG: Atrial paced ventricular sensed rhythm with right bundle branch block left anterior fascicular block  Lipid Panel     Component Value Date/Time   CHOL 147 03/16/2013 1028   TRIG 219* 03/16/2013 1028   HDL 36* 03/16/2013 1028   CHOLHDL 4.1 03/16/2013 1028   VLDL 44* 03/16/2013 1028   LDLCALC 67 03/16/2013 1028    BMET    Component Value Date/Time   NA 135 03/16/2013 1028   K 4.9 03/16/2013 1028   CL 102 03/16/2013 1028   CO2 32 03/16/2013 1028   GLUCOSE 83 03/16/2013 1028   BUN 30* 03/16/2013 1028   CREATININE 0.80 03/16/2013 1028   CREATININE 0.71 04/29/2012 1938   CALCIUM 10.3 03/16/2013 1028   GFRNONAA 84* 04/29/2012 1938   GFRAA >90 04/29/2012 1938     ASSESSMENT AND PLAN  No change in nature her medications or her  device settings at this appointment. She is encouraged to find local followup in [place] as soon as possible and we'll gladly send overall her records. She should identify a cardiologist that can monitor her pacemaker. In the meantime we will continue remote monitoring from [place].  Patient Instructions  Remote monitoring is used to monitor your pacemaker from home. This monitoring reduces the number of office visits required to check your device to one time per year. It allows [place] to keep an eye on the functioning of your device to ensure it is working properly. You are scheduled for a device check from home on 08-10-2013. You may send your transmission at any time that day. If you have a wireless device, the transmission will be sent automatically. After your physician reviews your transmission, you will receive a postcard with your next transmission date.  Your physician recommends that you schedule a follow-up appointment as needed.     Orders Placed This Encounter  Procedures  . Implantable device check   No orders of the defined types were placed in this encounter.    Junious Silk, MD, [facility] [phone] office [phone] pager

## 2013-06-05 ENCOUNTER — Encounter: Payer: Self-pay | Admitting: Internal Medicine

## 2013-06-29 ENCOUNTER — Other Ambulatory Visit: Payer: Self-pay | Admitting: Internal Medicine

## 2013-07-31 ENCOUNTER — Other Ambulatory Visit: Payer: Self-pay | Admitting: Internal Medicine

## 2013-08-10 ENCOUNTER — Telehealth: Payer: Self-pay | Admitting: Cardiology

## 2013-08-10 ENCOUNTER — Encounter: Payer: Medicare PPO | Admitting: *Deleted

## 2013-08-10 NOTE — Telephone Encounter (Signed)
Pt has recently moved to [place] and is having her device checked by Dr. [place].

## 2014-05-04 ENCOUNTER — Other Ambulatory Visit: Payer: Self-pay | Admitting: Internal Medicine

## 2014-05-24 IMAGING — CT CT TIBIA FIBULA *L* W/O CM
3 of 4 series · 13 of 28 positions shown, 15 images · non-contrast
Comparison: Radiographs 04/29/2012.

CLINICAL DATA: Left shin pain and swelling following injury 2 weeks
ago.  No previous relevant surgery.

CT TIBIA FIBULA LEFT WITHOUT CONTRAST

[Series 2: routine (id) · axial · 0.31mm/px · z∈[-172,+90]mm · 4 of 175 slices shown, 5 images]
[im 35/175  soft-tissue]
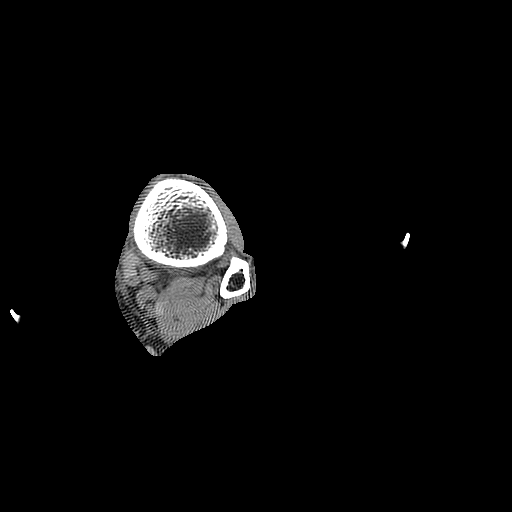
[im 35/175  bone]
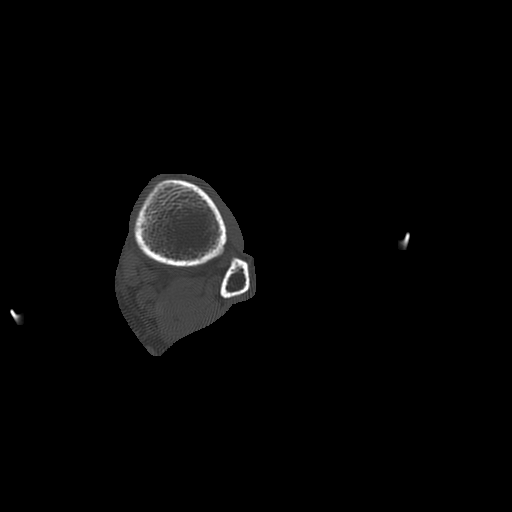
[im 70/175  bone]
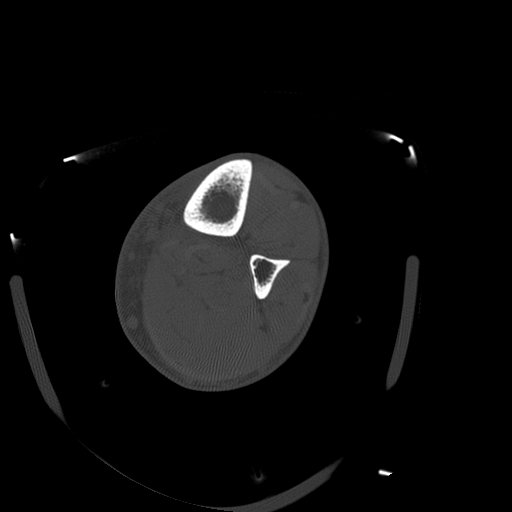
[im 105/175  bone]
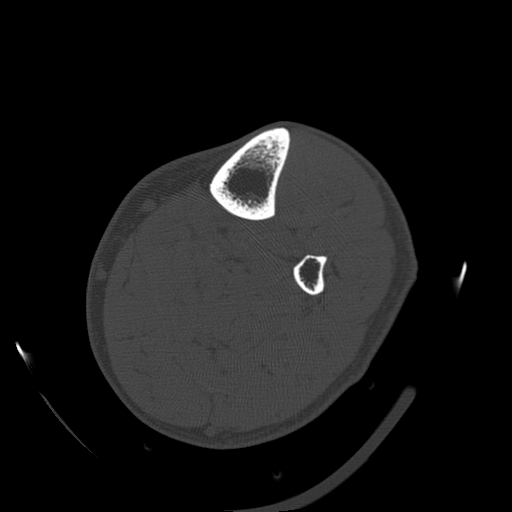
[im 140/175  bone]
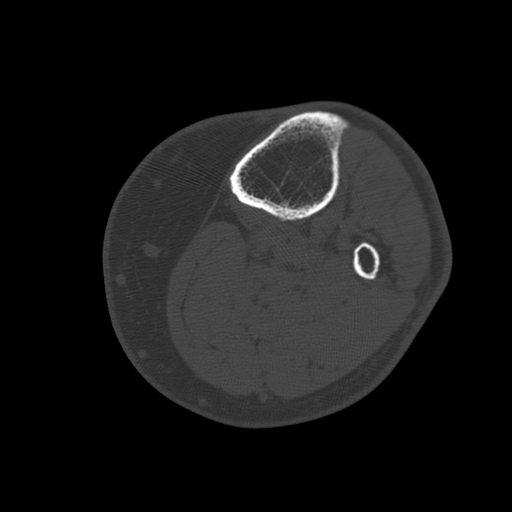

[Series 3: detail · axial · 0.31mm/px · z∈[-172,+90]mm · 4 of 175 slices shown]
[im 35/175  bone]
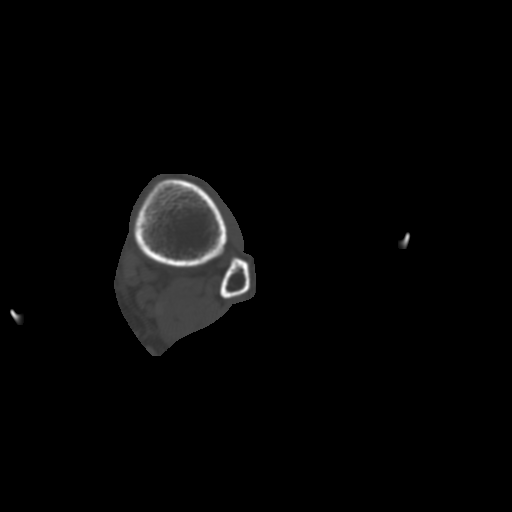
[im 70/175  bone]
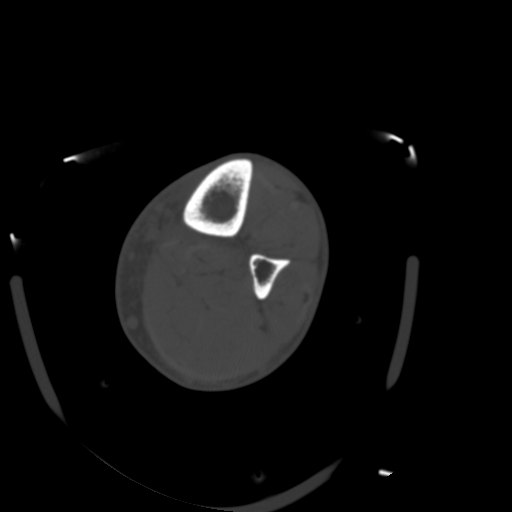
[im 105/175  bone]
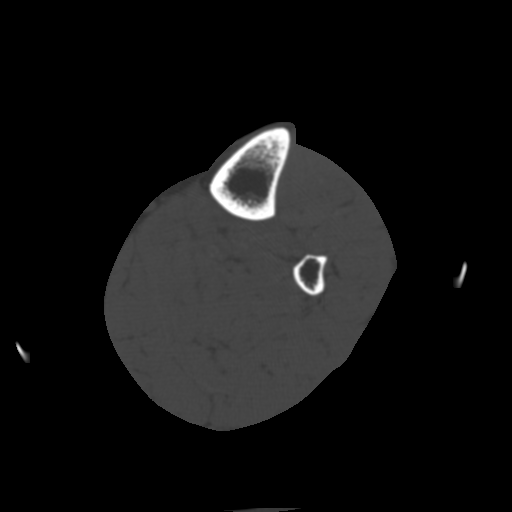
[im 140/175  bone]
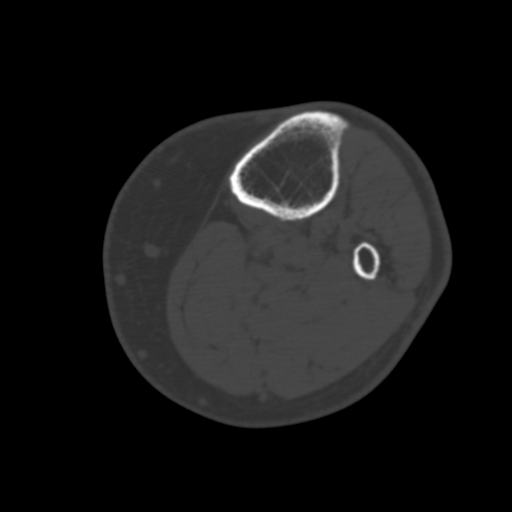

[Series 403: sagittal · sagittal · 0.87mm/px · 5 of 70 slices shown, 6 images]
[im 24/70  bone]
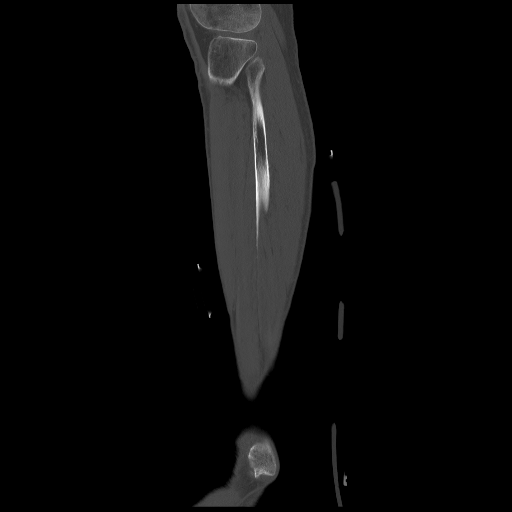
[im 29/70  bone]
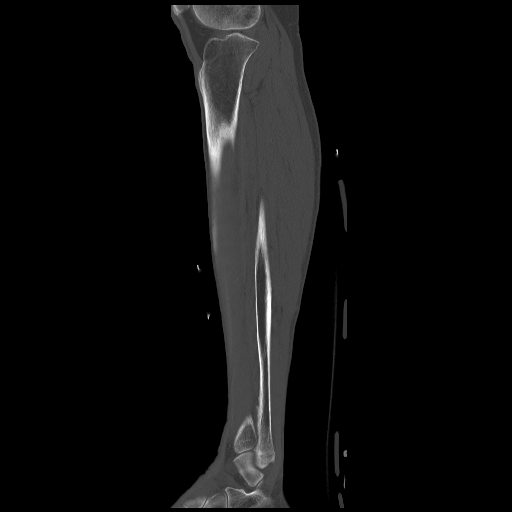
[im 35/70  soft-tissue]
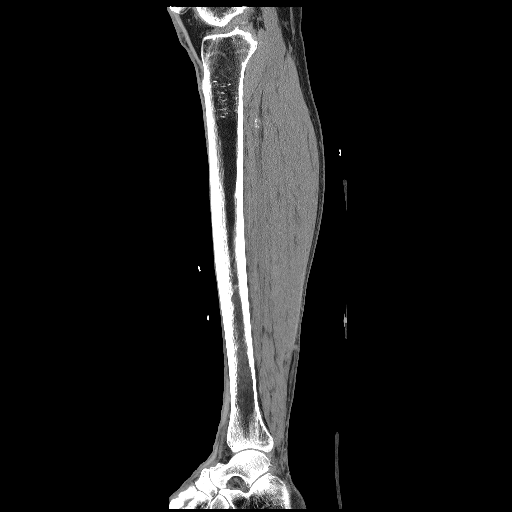
[im 35/70  bone]
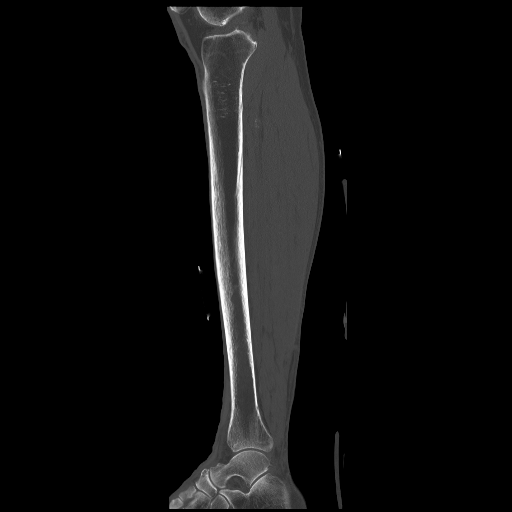
[im 41/70  bone]
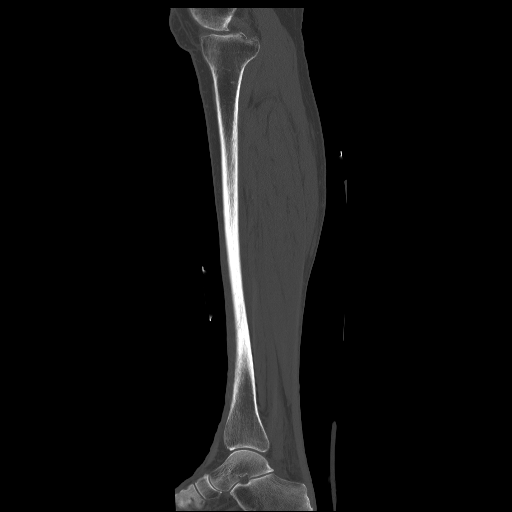
[im 47/70  bone]
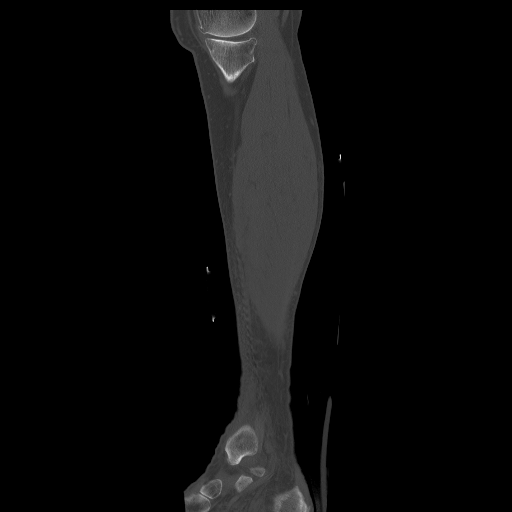

[13 of 28 positions shown; findings below may reference images not displayed]

FINDINGS: There is no evidence of acute fracture or dislocation.
Degenerative changes are present at the knee, most advanced
medially.  There is no focal osteochondral lesion.  There is no
periosteal reaction.  There is a well corticated ossicle anterior
to the distal fibula, likely an accessory ossicle or sequela of
remote trauma.

There is minimal pretibial edema distally.  No focal fluid
collection is identified.  The tendons within the distal lower leg
and ankle appear normal.  Scattered vascular calcifications are
noted.
IMPRESSION: 1.  No evidence of acute fracture or dislocation.
2.  No evidence of osteomyelitis.
3.  Mild nonspecific pretibial edema distally.  No focal fluid
collection.
4.  Degenerative changes at the knee.

## 2015-05-22 ENCOUNTER — Other Ambulatory Visit: Payer: Self-pay | Admitting: Internal Medicine

## 2015-08-25 ENCOUNTER — Encounter: Payer: Self-pay | Admitting: Gastroenterology

## 2015-09-01 ENCOUNTER — Emergency Department (HOSPITAL_COMMUNITY): Payer: Medicare PPO

## 2015-09-01 ENCOUNTER — Encounter (HOSPITAL_COMMUNITY): Payer: Self-pay | Admitting: Emergency Medicine

## 2015-09-01 ENCOUNTER — Observation Stay (HOSPITAL_COMMUNITY)
Admission: EM | Admit: 2015-09-01 | Discharge: 2015-09-03 | Disposition: A | Discharge status: Home / Self Care | Payer: Medicare PPO | Attending: Family Medicine | Admitting: Family Medicine

## 2015-09-01 DIAGNOSIS — J449 Chronic obstructive pulmonary disease, unspecified: Secondary | ICD-10-CM | POA: Insufficient documentation

## 2015-09-01 DIAGNOSIS — F17210 Nicotine dependence, cigarettes, uncomplicated: Secondary | ICD-10-CM | POA: Insufficient documentation

## 2015-09-01 DIAGNOSIS — Z7983 Long term (current) use of bisphosphonates: Secondary | ICD-10-CM | POA: Insufficient documentation

## 2015-09-01 DIAGNOSIS — Z9862 Peripheral vascular angioplasty status: Secondary | ICD-10-CM | POA: Diagnosis not present

## 2015-09-01 DIAGNOSIS — Z7982 Long term (current) use of aspirin: Secondary | ICD-10-CM | POA: Diagnosis not present

## 2015-09-01 DIAGNOSIS — I422 Other hypertrophic cardiomyopathy: Secondary | ICD-10-CM | POA: Diagnosis not present

## 2015-09-01 DIAGNOSIS — R569 Unspecified convulsions: Secondary | ICD-10-CM | POA: Diagnosis not present

## 2015-09-01 DIAGNOSIS — R531 Weakness: Secondary | ICD-10-CM | POA: Insufficient documentation

## 2015-09-01 DIAGNOSIS — I110 Hypertensive heart disease with heart failure: Secondary | ICD-10-CM | POA: Diagnosis not present

## 2015-09-01 DIAGNOSIS — Z79899 Other long term (current) drug therapy: Secondary | ICD-10-CM | POA: Insufficient documentation

## 2015-09-01 DIAGNOSIS — E039 Hypothyroidism, unspecified: Secondary | ICD-10-CM | POA: Diagnosis not present

## 2015-09-01 DIAGNOSIS — I5032 Chronic diastolic (congestive) heart failure: Secondary | ICD-10-CM | POA: Insufficient documentation

## 2015-09-01 DIAGNOSIS — T829XXA Unspecified complication of cardiac and vascular prosthetic device, implant and graft, initial encounter: Secondary | ICD-10-CM | POA: Insufficient documentation

## 2015-09-01 DIAGNOSIS — E785 Hyperlipidemia, unspecified: Secondary | ICD-10-CM | POA: Diagnosis not present

## 2015-09-01 DIAGNOSIS — E876 Hypokalemia: Secondary | ICD-10-CM | POA: Insufficient documentation

## 2015-09-01 DIAGNOSIS — I480 Paroxysmal atrial fibrillation: Secondary | ICD-10-CM | POA: Insufficient documentation

## 2015-09-01 DIAGNOSIS — R4701 Aphasia: Secondary | ICD-10-CM

## 2015-09-01 DIAGNOSIS — E559 Vitamin D deficiency, unspecified: Secondary | ICD-10-CM | POA: Insufficient documentation

## 2015-09-01 DIAGNOSIS — R0600 Dyspnea, unspecified: Secondary | ICD-10-CM

## 2015-09-01 DIAGNOSIS — Z7901 Long term (current) use of anticoagulants: Secondary | ICD-10-CM | POA: Insufficient documentation

## 2015-09-01 DIAGNOSIS — I2510 Atherosclerotic heart disease of native coronary artery without angina pectoris: Secondary | ICD-10-CM | POA: Insufficient documentation

## 2015-09-01 DIAGNOSIS — I495 Sick sinus syndrome: Secondary | ICD-10-CM | POA: Diagnosis not present

## 2015-09-01 DIAGNOSIS — Z950 Presence of cardiac pacemaker: Secondary | ICD-10-CM

## 2015-09-01 LAB — I-STAT CHEM 8, ED
BUN: 14 mg/dL (ref 6–20)
CALCIUM ION: 1.06 mmol/L — AB (ref 1.13–1.30)
CHLORIDE: 102 mmol/L (ref 101–111)
CREATININE: 0.6 mg/dL (ref 0.44–1.00)
GLUCOSE: 92 mg/dL (ref 65–99)
HCT: 44 % (ref 36.0–46.0)
Hemoglobin: 15 g/dL (ref 12.0–15.0)
POTASSIUM: 3.3 mmol/L — AB (ref 3.5–5.1)
Sodium: 144 mmol/L (ref 135–145)
TCO2: 28 mmol/L (ref 0–100)

## 2015-09-01 LAB — I-STAT TROPONIN, ED: TROPONIN I, POC: 0.01 ng/mL (ref 0.00–0.08)

## 2015-09-01 LAB — DIFFERENTIAL
BASOS ABS: 0 10*3/uL (ref 0.0–0.1)
BASOS PCT: 0 %
Eosinophils Absolute: 0.2 10*3/uL (ref 0.0–0.7)
Eosinophils Relative: 3 %
LYMPHS PCT: 26 %
Lymphs Abs: 1.7 10*3/uL (ref 0.7–4.0)
MONO ABS: 0.8 10*3/uL (ref 0.1–1.0)
MONOS PCT: 12 %
NEUTROS ABS: 4 10*3/uL (ref 1.7–7.7)
Neutrophils Relative %: 59 %

## 2015-09-01 LAB — COMPREHENSIVE METABOLIC PANEL
ALK PHOS: 43 U/L (ref 38–126)
ALT: 18 U/L (ref 14–54)
AST: 30 U/L (ref 15–41)
Albumin: 4.2 g/dL (ref 3.5–5.0)
Anion gap: 9 (ref 5–15)
BUN: 11 mg/dL (ref 6–20)
CALCIUM: 9.2 mg/dL (ref 8.9–10.3)
CHLORIDE: 103 mmol/L (ref 101–111)
CO2: 30 mmol/L (ref 22–32)
CREATININE: 0.75 mg/dL (ref 0.44–1.00)
Glucose, Bld: 89 mg/dL (ref 65–99)
Potassium: 3.5 mmol/L (ref 3.5–5.1)
Sodium: 142 mmol/L (ref 135–145)
Total Bilirubin: 0.9 mg/dL (ref 0.3–1.2)
Total Protein: 7.6 g/dL (ref 6.5–8.1)

## 2015-09-01 LAB — CBC
HEMATOCRIT: 42.8 % (ref 36.0–46.0)
HEMOGLOBIN: 14 g/dL (ref 12.0–15.0)
MCH: 26.7 pg (ref 26.0–34.0)
MCHC: 32.7 g/dL (ref 30.0–36.0)
MCV: 81.5 fL (ref 78.0–100.0)
Platelets: 192 10*3/uL (ref 150–400)
RBC: 5.25 MIL/uL — AB (ref 3.87–5.11)
RDW: 14.2 % (ref 11.5–15.5)
WBC: 6.7 10*3/uL (ref 4.0–10.5)

## 2015-09-01 LAB — TSH: TSH: 1.594 u[IU]/mL (ref 0.350–4.500)

## 2015-09-01 LAB — APTT: APTT: 35 s (ref 24–37)

## 2015-09-01 LAB — PROTIME-INR
INR: 1.87 — ABNORMAL HIGH (ref 0.00–1.49)
Prothrombin Time: 21.5 seconds — ABNORMAL HIGH (ref 11.6–15.2)

## 2015-09-01 LAB — MAGNESIUM: Magnesium: 1.8 mg/dL (ref 1.7–2.4)

## 2015-09-01 MED ORDER — SODIUM CHLORIDE 0.9 % IV SOLN
500.0000 mg | Freq: Two times a day (BID) | INTRAVENOUS | Status: DC
  Administered 2015-09-01 – 2015-09-03 (×5): 500 mg via INTRAVENOUS
  Filled 2015-09-01 (×7): qty 5

## 2015-09-01 MED ORDER — ENALAPRIL MALEATE 20 MG PO TABS
20.0000 mg | ORAL_TABLET | Freq: Two times a day (BID) | ORAL | Status: DC
  Administered 2015-09-01 – 2015-09-03 (×3): 20 mg via ORAL
  Filled 2015-09-01 (×5): qty 1

## 2015-09-01 MED ORDER — WARFARIN - PHARMACIST DOSING INPATIENT: Freq: Every day | Status: DC

## 2015-09-01 MED ORDER — DILTIAZEM HCL ER COATED BEADS 180 MG PO CP24
180.0000 mg | ORAL_CAPSULE | Freq: Two times a day (BID) | ORAL | Status: DC
  Administered 2015-09-01 – 2015-09-02 (×2): 180 mg via ORAL
  Filled 2015-09-01 (×2): qty 1

## 2015-09-01 MED ORDER — WARFARIN SODIUM 2.5 MG PO TABS
2.5000 mg | ORAL_TABLET | Freq: Once | ORAL | Status: DC
  Administered 2015-09-01: 2.5 mg via ORAL
  Filled 2015-09-01: qty 1

## 2015-09-01 MED ORDER — LEVOTHYROXINE SODIUM 75 MCG PO TABS
75.0000 ug | ORAL_TABLET | Freq: Every day | ORAL | Status: DC
  Administered 2015-09-02 – 2015-09-03 (×2): 75 ug via ORAL
  Filled 2015-09-01 (×3): qty 1

## 2015-09-01 MED ORDER — ACETAMINOPHEN 650 MG RE SUPP: 650.0000 mg | Freq: Four times a day (QID) | RECTAL | Status: DC | PRN

## 2015-09-01 MED ORDER — ASPIRIN 81 MG PO CHEW
81.0000 mg | CHEWABLE_TABLET | Freq: Every day | ORAL | Status: DC
  Administered 2015-09-02 – 2015-09-03 (×2): 81 mg via ORAL
  Filled 2015-09-01 (×3): qty 1

## 2015-09-01 MED ORDER — POTASSIUM CHLORIDE CRYS ER 20 MEQ PO TBCR
40.0000 meq | EXTENDED_RELEASE_TABLET | Freq: Once | ORAL | Status: AC
  Administered 2015-09-01: 40 meq via ORAL
  Filled 2015-09-01: qty 2

## 2015-09-01 MED ORDER — SODIUM CHLORIDE 0.9% FLUSH
3.0000 mL | Freq: Two times a day (BID) | INTRAVENOUS | Status: DC
  Administered 2015-09-01 – 2015-09-03 (×4): 3 mL via INTRAVENOUS

## 2015-09-01 MED ORDER — ACETAMINOPHEN 325 MG PO TABS: 650.0000 mg | ORAL_TABLET | Freq: Four times a day (QID) | ORAL | Status: DC | PRN

## 2015-09-01 MED ORDER — SODIUM CHLORIDE 0.9 % IV SOLN: 250.0000 mL | INTRAVENOUS | Status: DC | PRN

## 2015-09-01 MED ORDER — PRAVASTATIN SODIUM 40 MG PO TABS: 40.0000 mg | ORAL_TABLET | Freq: Every day | ORAL | Status: DC

## 2015-09-01 MED ORDER — ATENOLOL 50 MG PO TABS
100.0000 mg | ORAL_TABLET | Freq: Two times a day (BID) | ORAL | Status: DC
  Administered 2015-09-01: 100 mg via ORAL
  Filled 2015-09-01 (×2): qty 2

## 2015-09-01 MED ORDER — LEVETIRACETAM 500 MG PO TABS: 500.0000 mg | ORAL_TABLET | Freq: Once | ORAL | Status: DC

## 2015-09-01 MED ORDER — SODIUM CHLORIDE 0.9% FLUSH
3.0000 mL | INTRAVENOUS | Status: DC | PRN
  Administered 2015-09-01 (×2): 3 mL via INTRAVENOUS
  Filled 2015-09-01 (×2): qty 3

## 2015-09-01 NOTE — ED Provider Notes (Signed)
CSN: [id number]     Arrival date & time 09/01/15  1330 History   First MD Initiated Contact with Patient 09/01/15 1503     Chief Complaint  Patient presents with  . Stroke Symptoms     (Consider location/radiation/quality/duration/timing/severity/associated sxs/prior Treatment) HPI Patient Had episode lasting 1 minute 11 AM today where she could not stop her right leg shaking. She was standing at the sink at that time. Symptoms resolve spontaneously without treatment. She denies any visual changes difficulty in speaking no focal numbness or weakness. She's never had similar symptoms before.Code stroke was called in the field. And cancelled by neurology after arrival here. Past Medical History  Diagnosis Date  . Coronary artery disease   . Hypertension   . Thyroid disease   . 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 PAF (paroxysmal atrial fibrillation) (HCC)   . Hypertrophic cardiomyopathy (HCC)     apical-variant  . Hyperlipidemia   . COPD (chronic obstructive pulmonary disease) (HCC)   . Tobacco abuse     quit 12/2011  . History of nuclear stress test 12/2010    lexiscan; normal pattern of perfusion; post-stress EF 52%; normal, low risk study   . Renal artery stenosis (HCC)     right renal artery stent  . Unspecified vitamin D deficiency    Past Surgical History  Procedure Laterality Date  . Pacemaker insertion  07/2005    dual-chamber  . Cardiac catheterization  07/23/2005    non-critical CAD (Dr. Laurell Josephs)  . Renal artery angioplasty Right 10/20/1998    23mmx1.5cm PoweFlex balloon dilated up to 8 atmospheres, post-dilated with 44mmx1.5cm PowerFlex up to 8 atmospheres, 80% stenosis to 0%, renal artery stent (Dr. Erlene Quan)  . Breast biopsy  1990s  . Tubal ligation  1969  . Transthoracic echocardiogram  12/2010    EF >70%, severe asymmetric LVH, mid-cavitary gradient is present, evidence of apical entrapment & limiated apical  infarction (hypertrophic cardiomyopathy); normal RVSP; LA mod dilated; mild MR; trace TR; trace PV regurg   . Renal artery doppler  5/08/28/2012    distal abdominal aorta 0-49% diameter reduction; R renal artery stent =/>60% diameter reduction; L renal artery 1-59% diameter reduction    Family History  Problem Relation Age of Onset  . Heart disease Mother   . Lung disease Mother   . Hypertension Mother   . Hypertension Father   . Cancer Sister     x2  . Hypertension Sister     x4  . Lung disease Sister   . Hypertension Child     x3  . Heart murmur Child   . Diabetes Child    Social History  Substance Use Topics  . Smoking status: Current Some Day Smoker -- 0.25 packs/day    Types: Cigarettes  . Smokeless tobacco: Never Used  . Alcohol Use: No   OB History    No data available     Review of Systems  Constitutional: Negative.   HENT: Negative.   Respiratory: Negative.   Cardiovascular: Negative.   Gastrointestinal: Negative.   Musculoskeletal: Negative.   Skin: Negative.   Neurological: Positive for tremors.       Tremor of right leg  Psychiatric/Behavioral: Negative.   All other systems reviewed and are negative.     Allergies  Caffeine; Chocolate; Flax seed; Magnesium-containing compounds; Adhesive; Ivp dye; and Tricor  Home Medications   Prior to Admission medications   Medication Sig Start Date  End Date Taking? Authorizing Provider  alendronate (FOSAMAX) 70 MG tablet Take 70 mg by mouth once a week. Take with a full glass of water on an empty stomach.    Historical Provider, MD  aspirin 81 MG chewable tablet Chew 81 mg by mouth daily.    Historical Provider, MD  atenolol (TENORMIN) 100 MG tablet TAKE 1 TABLET BY MOUTH  TWICE DAILY FOR BLOOD PRESSURE AND HEART 04/14/13   Lucky Cowboy, MD  cholecalciferol (VITAMIN D) 1000 UNITS tablet Take 5,000 Units by mouth daily.    Historical Provider, MD  diltiazem (CARDIZEM CD) 180 MG 24 hr capsule Take 180 mg by mouth  2 (two) times daily.  02/24/13   Historical Provider, MD  enalapril (VASOTEC) 20 MG tablet Take 20 mg by mouth 2 (two) times daily.    Historical Provider, MD  fenofibrate micronized (LOFIBRA) 134 MG capsule Take 134 mg by mouth daily before breakfast.    Historical Provider, MD  hydrochlorothiazide (MICROZIDE) 12.5 MG capsule Take 12.5 mg by mouth daily.    Historical Provider, MD  levothyroxine (SYNTHROID, LEVOTHROID) 75 MCG tablet take 1 tablet by mouth once daily 02/20/13   Loree Fee, PA-C  Multiple Vitamin (MULTIVITAMIN WITH MINERALS) TABS Take 1 tablet by mouth daily.    Historical Provider, MD  pravastatin (PRAVACHOL) 20 MG tablet Take 10 mg by mouth every other day. Monday Wednesday Friday    Historical Provider, MD  pravastatin (PRAVACHOL) 40 MG tablet take 1 tablet by mouth at bedtime for CHOLESTEROL 05/04/14   Lucky Cowboy, MD  warfarin (COUMADIN) 2.5 MG tablet Take 2.5 mg by mouth daily.    Historical Provider, MD   BP 143/58 mmHg  Pulse 115  Temp(Src) 98.1 F (36.7 C) (Oral)  Resp 20  Ht  (1.676 m)  Wt 140 lb (63.504 kg)  BMI 22.61 kg/m2  SpO2 94% Physical Exam  Constitutional: She is oriented to person, place, and time. She appears well-developed and well-nourished.  HENT:  Head: Normocephalic and atraumatic.  Eyes: Conjunctivae are normal. Pupils are equal, round, and reactive to light.  Neck: Neck supple. No tracheal deviation present. No thyromegaly present.  Cardiovascular: Normal rate and regular rhythm.   No murmur heard. Pulmonary/Chest: Effort normal and breath sounds normal.  Abdominal: Soft. Bowel sounds are normal. She exhibits no distension. There is no tenderness.  Musculoskeletal: Normal range of motion. She exhibits no edema or tenderness.  Neurological: She is alert and oriented to person, place, and time. She has normal reflexes. No cranial nerve deficit. Coordination normal.  DTRs symmetric bilaterally at knee jerk ankle jerk and biceps toes  downward going bilaterally. Finger to nose normal heel to shin normal pronator drift normal  Skin: Skin is warm and dry. No rash noted.  Psychiatric: She has a normal mood and affect.  Nursing note and vitals reviewed.   ED Course  Procedures (including critical care time) Labs Review Labs Reviewed  PROTIME-INR - Abnormal; Notable for the following:    Prothrombin Time 21.5 (*)    INR 1.87 (*)    All other components within normal limits  CBC - Abnormal; Notable for the following:    RBC 5.25 (*)    All other components within normal limits  I-STAT CHEM 8, ED - Abnormal; Notable for the following:    Potassium 3.3 (*)    Calcium, Ion 1.06 (*)    All other components within normal limits  APTT  DIFFERENTIAL  COMPREHENSIVE METABOLIC PANEL  MAGNESIUM  Rosezena Sensor, ED    Imaging Review Ct Head Wo Contrast  09/01/2015  CLINICAL DATA:  Left lower extremity weakness.  Forgetfulness. EXAM: CT HEAD WITHOUT CONTRAST TECHNIQUE: Contiguous axial images were obtained from the base of the skull through the vertex without intravenous contrast. COMPARISON:  None. FINDINGS: There is age related volume loss. There is no intracranial mass, hemorrhage, extra-axial fluid collection, or midline shift. There is patchy small vessel disease throughout the centra semiovale bilaterally. There is small vessel disease in the anterior limbs of each internal and external capsule. Elsewhere gray-white compartments appear normal. No acute infarct is evident. Middle cerebral artery attenuation is symmetric bilaterally. The bony calvarium appears intact. The mastoid air cells are clear. No intraorbital lesions are evident. There is a sebaceous cyst in the soft tissues adjacent to the high right parietal bone measuring 1 x 1 cm. IMPRESSION: Age related volume loss with supratentorial small vessel disease. No acute infarct evident. No hemorrhage or mass effect. Critical Value/emergent results were called by telephone  at the time of interpretation on 09/01/2015 at 2:35 pm to Felicie Morn PA, who verbally acknowledged these results. Electronically Signed   By: Bretta Bang III M.D.   On: 09/01/2015 14:36   I have personally reviewed and evaluated these images and lab results as part of my medical decision-making.     Results for orders placed or performed during the hospital encounter of 09/01/15  Protime-INR  Result Value Ref Range   Prothrombin Time 21.5 (H) 11.6 - 15.2 seconds   INR 1.87 (H) 0.00 - 1.49  APTT  Result Value Ref Range   aPTT 35 24 - 37 seconds  CBC  Result Value Ref Range   WBC 6.7 4.0 - 10.5 K/uL   RBC 5.25 (H) 3.87 - 5.11 MIL/uL   Hemoglobin 14.0 12.0 - 15.0 g/dL   HCT 81.1 91.4 - 78.2 %   MCV 81.5 78.0 - 100.0 fL   MCH 26.7 26.0 - 34.0 pg   MCHC 32.7 30.0 - 36.0 g/dL   RDW 95.6 21.3 - 08.6 %   Platelets 192 150 - 400 K/uL  Differential  Result Value Ref Range   Neutrophils Relative % 59 %   Neutro Abs 4.0 1.7 - 7.7 K/uL   Lymphocytes Relative 26 %   Lymphs Abs 1.7 0.7 - 4.0 K/uL   Monocytes Relative 12 %   Monocytes Absolute 0.8 0.1 - 1.0 K/uL   Eosinophils Relative 3 %   Eosinophils Absolute 0.2 0.0 - 0.7 K/uL   Basophils Relative 0 %   Basophils Absolute 0.0 0.0 - 0.1 K/uL  Comprehensive metabolic panel  Result Value Ref Range   Sodium 142 135 - 145 mmol/L   Potassium 3.5 3.5 - 5.1 mmol/L   Chloride 103 101 - 111 mmol/L   CO2 30 22 - 32 mmol/L   Glucose, Bld 89 65 - 99 mg/dL   BUN 11 6 - 20 mg/dL   Creatinine, Ser 5.78 0.44 - 1.00 mg/dL   Calcium 9.2 8.9 - 46.9 mg/dL   Total Protein 7.6 6.5 - 8.1 g/dL   Albumin 4.2 3.5 - 5.0 g/dL   AST 30 15 - 41 U/L   ALT 18 14 - 54 U/L   Alkaline Phosphatase 43 38 - 126 U/L   Total Bilirubin 0.9 0.3 - 1.2 mg/dL   GFR calc non Af Amer >60 >60 mL/min   GFR calc Af Amer >60 >60 mL/min   Anion gap 9 5 -  15  Magnesium  Result Value Ref Range   Magnesium 1.8 1.7 - 2.4 mg/dL  I-stat troponin, ED  Result Value Ref Range    Troponin i, poc 0.01 0.00 - 0.08 ng/mL   Comment 3          I-Stat Chem 8, ED  Result Value Ref Range   Sodium 144 135 - 145 mmol/L   Potassium 3.3 (L) 3.5 - 5.1 mmol/L   Chloride 102 101 - 111 mmol/L   BUN 14 6 - 20 mg/dL   Creatinine, Ser 4.090.60 0.44 - 1.00 mg/dL   Glucose, Bld 92 65 - 99 mg/dL   Calcium, Ion 8.111.06 (L) 1.13 - 1.30 mmol/L   TCO2 28 0 - 100 mmol/L   Hemoglobin 15.0 12.0 - 15.0 g/dL   HCT 91.444.0 78.236.0 - 95.646.0 %   Ct Head Wo Contrast  09/01/2015  CLINICAL DATA:  Left lower extremity weakness.  Forgetfulness. EXAM: CT HEAD WITHOUT CONTRAST TECHNIQUE: Contiguous axial images were obtained from the base of the skull through the vertex without intravenous contrast. COMPARISON:  None. FINDINGS: There is age related volume loss. There is no intracranial mass, hemorrhage, extra-axial fluid collection, or midline shift. There is patchy small vessel disease throughout the centra semiovale bilaterally. There is small vessel disease in the anterior limbs of each internal and external capsule. Elsewhere gray-white compartments appear normal. No acute infarct is evident. Middle cerebral artery attenuation is symmetric bilaterally. The bony calvarium appears intact. The mastoid air cells are clear. No intraorbital lesions are evident. There is a sebaceous cyst in the soft tissues adjacent to the high right parietal bone measuring 1 x 1 cm. IMPRESSION: Age related volume loss with supratentorial small vessel disease. No acute infarct evident. No hemorrhage or mass effect. Critical Value/emergent results were called by telephone at the time of interpretation on 09/01/2015 at 2:35 pm to Felicie Mornavid Smith PA, who verbally acknowledged these results. Electronically Signed   By: Bretta BangWilliam  Woodruff III M.D.   On: 09/01/2015 14:36    Further history obtained from Dr. Amada JupiterKirkpatrick. Patient reported she had 4 similar episodes of right leg shaking. Earlier today. At 6:10 PM she isn't no distress. Alert asymptomatic. ED  ECG REPORT   Date: 09/01/2015  Rate: 75  Rhythm: Electronically paced  QRS Axis: left  Intervals: normal  ST/T Wave abnormalities: nonspecific T wave changes  Conduction Disutrbances:nonspecific intraventricular conduction delay  Narrative Interpretation:   Old EKG Reviewed: unchanged  I have personally reviewed the EKG tracing and agree with the computerized printout as noted. MDM  Dr.Mikell family medicine service consulted. Plan 23 hour observation. Keppra ordered. Total telemetry Diagnosis focal seizures Final diagnoses:  None        Doug SouSam Lacharles Altschuler, MD 09/01/15 [id number]

## 2015-09-01 NOTE — Progress Notes (Signed)
ANTICOAGULATION CONSULT NOTE - Initial Consult  Pharmacy Consult for warfarin Indication: atrial fibrillation  Allergies  Allergen Reactions  . Caffeine Other (See Comments)    Heart flutters  . Chocolate Diarrhea  . Flax Seed [Bio-Flax]   . Magnesium-Containing Compounds   . Adhesive [Tape] Rash  . Ivp Dye [Iodinated Diagnostic Agents] Rash  . Tricor [Fenofibrate] Rash    Patient Measurements: Height: 5\' 6"  (167.6 cm) Weight: 140 lb (63.504 kg) IBW/kg (Calculated) : 59.3  Vital Signs: Temp: 98.1 F (36.7 C) (06/08 1357) Temp Source: Oral (06/08 1357) BP: 153/57 mmHg (06/08 2000) Pulse Rate: 45 (06/08 2000)  Labs:  Recent Labs  09/01/15 1405 09/01/15 1450  HGB 14.0 15.0  HCT 42.8 44.0  PLT 192  --   APTT 35  --   LABPROT 21.5*  --   INR 1.87*  --   CREATININE 0.75 0.60    Estimated Creatinine Clearance: 56.9 mL/min (by C-G formula based on Cr of 0.6).   Medical History: Past Medical History  Diagnosis Date  . Coronary artery disease   . Hypertension   . Thyroid disease   . 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 PAF (paroxysmal atrial fibrillation) (HCC)   . Hypertrophic cardiomyopathy (HCC)     apical-variant  . Hyperlipidemia   . COPD (chronic obstructive pulmonary disease) (HCC)   . Tobacco abuse     quit 12/2011  . History of nuclear stress test 12/2010    lexiscan; normal pattern of perfusion; post-stress EF 52%; normal, low risk study   . Renal artery stenosis (HCC)     right renal artery stent  . Unspecified vitamin D deficiency    Assessment: Deanna Poole admitted 6/8 with concern for seizure. On warfarin PTA for afib. Admit INR 1.87, CBC stable with no reported bleeding and last dose taken on 6/7 pm.  Pharmacy asked to assist with dosing while inpatient.   PTA dose of warfarin was 1 mg on Tuesday and Saturday and 2.5 mg AOD's   Goal of Therapy:  INR 2-3 Monitor platelets by anticoagulation  protocol: Yes   Plan:  1. Warfarin 2.5 mg x 1 tonight 2. Daily INR  Pollyann SamplesAndy Steed Kanaan, PharmD, BCPS 09/01/2015, 8:47 PM Pager: [phone]

## 2015-09-01 NOTE — ED Notes (Signed)
Pt had an episode of right leg weakness started this am with leg trembling/ "I did not have control of my ankle" - called EMS-- but did not want to come to hospital then-- neighbor took pt to urgent care, pt was not able walk on right leg- pt is alert/oriented, no hx of stroke in past.    medtronic pacemaker--  Stent to kidney

## 2015-09-01 NOTE — ED Notes (Signed)
Patient heading to CT 2 with Clydie BraunKaren, RN

## 2015-09-01 NOTE — Progress Notes (Signed)
EEG Completed; Results Pending  

## 2015-09-01 NOTE — H&P (Signed)
[facility] Admission History and Physical Service Pager: [phone]  Patient name: Deanna LeyBarbara M Poole Medical record number: [id number] Date of birth: [birth year] Age: 75 y.o. Gender: female  Primary Care Provider: Pcp Not In System Consultants: Neurology  Code Status: Full   Chief Complaint:   Assessment and Plan: Deanna Poole is a 75 y.o. female presenting with right leg weakness . PMH is significant for CHF, Atrial fibrillation, Hyperlipidemia, HTN, hx of tachycardia-bradycardia syndrome, CAD, Renal artery stenosis,   Intermittent leg jerking and Weakness: Could consider TIA, however patient with multiple resolving episodes of focal right leg weakness with jerking inconsistent with TIA. Per neurology presentation suspicious for simple partial seizure with jacksonian march from foot to whole leg. CT head negative for acute process. No signs of radiculopathy. No recent head trauma/no hx of stroke. No hx of new mediations. Denies any systemic symptoms of weight loss, fever or chills. No symptoms of elevated intercranial such as nausea or vomiting. No cancer hx, screening up-to-date, however 40 pack-year smoking hx.  Electrolytes normal. CBC normal.  - Admit to telemetry observation, Dr. Leveda AnnaHensel as attending  - Keppra 500 mg IV BID - Not able to obtain MRI as patient with pacemaker - EEG in the AM  - Neuro checks every 2 hours   - Vitals per floor protocol  - Nurse bedside swallow  - Seizure precautions  - Pulse oximetry, sats above 92%  -  If EEG,  ECHO in 2016 noted to have possible small mobile echodensity associated with RA lead, could consider repeat ECHO/TEE  - PT/OT eval  Hypothyroidism  - Continue home synthroid 75 mcg  - Will get TSH   HTN  - Continue home vasotec 20 mg BID and atenolol 100 mg BID   Slight hypokalemia: K 3.3  - Replete with 40 KDUR   HFpEF: EF 65-70%, Diastolic dysfunction. Evolumeic on exam  - Atenolol 100 mg BID    Paroxysmal Atrial Fibrillation: Pacemaker 07/2005 dual Chamber pacemaker for AFib and hx of tachy-brady syndrome.  INR 1.87.  Home Wafarin 2.5mg  every day except on Tuesdays and Saturday take 1mg  per patient.  -  Warfarin per pharmacy  -  Diltiazem 180 mg once daily   Renal artery stenosis: right renal artery stent in place   HLD:  - Continue Pravastatin 40 mg   FEN/GI: Bedside swallow, carb modified diet, saline lock  Prophylaxis: Warfarin   Disposition: Telemetry   History of Present Illness:  Deanna Poole is a 75 y.o. female presenting with jerking and weakness in right leg. Patient states she woke up this morning and went to the bathroom as she was returning from the bathroom her leg began trembling and became weak. This happened around 11 AM.  Patient called EMS following the episode, however weakness had resolved and patient was did not want to go the hospital at that time. Later on in the day, patient decide with much convincing to come the hospital. She states she had another episode of weakness/jerking earlier in the day. Then while at the hospital, patient had a 3rd episode where she was unable to get up to the bedside commode.  Each of these episode quickly resolved, patient was unable to give an exact amount of time. However indicated that they lasted less than 5 minutes. She states during episode she does sometimes feel slightly confused. However denies any residual effects following the episode. Patient denies any back pain, numbness or radiculopathy. No recent hx of  head trauma, LOC. Denies any nausea/vomiting, fever/chills or weight loss. no bowel or bladder incontinence. No hx of cancer, colonscopy and mammograms - uptodate and normal per patient. No new medications.   Initial in the ED, a code stroke was called. Neurology evaluated and believed patient's symptoms more consistent with focal seizure. Patient denies any hx of seizure. CT head was negative for acute findings.  No family hx of seizures (son has seizures following car accident).  Review Of Systems: Per HPI with the following additions:   Review of Systems  Respiratory: Negative for cough and shortness of breath.   Cardiovascular: Negative for chest pain, palpitations and leg swelling.  Gastrointestinal: Negative for nausea and vomiting.  Genitourinary: Negative for dysuria, frequency and hematuria.  Neurological: Negative for speech change and loss of consciousness.  Endo/Heme/Allergies: Does not bruise/bleed easily.   Otherwise the remainder of the systems were negative.  Patient Active Problem List   Diagnosis Date Noted  . Hypertension   . Hyperlipidemia   . Unspecified vitamin D deficiency   . Long term (current) use of anticoagulants 06/10/2012  . Pacemaker   . HEMORRHAGIC DISORDER DUE INTRINSIC CIRC ANTICOAG 04/28/2008  . ATRIAL FIBRILLATION 04/28/2008  . CHF 04/28/2008    Past Medical History: Past Medical History  Diagnosis Date  . Coronary artery disease   . Hypertension   . Thyroid disease   . 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 PAF (paroxysmal atrial fibrillation) (HCC)   . Hypertrophic cardiomyopathy (HCC)     apical-variant  . Hyperlipidemia   . COPD (chronic obstructive pulmonary disease) (HCC)   . Tobacco abuse     quit 12/2011  . History of nuclear stress test 12/2010    lexiscan; normal pattern of perfusion; post-stress EF 52%; normal, low risk study   . Renal artery stenosis (HCC)     right renal artery stent  . Unspecified vitamin D deficiency     Past Surgical History: Past Surgical History  Procedure Laterality Date  . Pacemaker insertion  07/2005    dual-chamber  . Cardiac catheterization  07/23/2005    non-critical CAD (Dr. Laurell Josephs)  . Renal artery angioplasty Right 10/20/1998    62mmx1.5cm PoweFlex balloon dilated up to 8 atmospheres, post-dilated with 35mmx1.5cm PowerFlex up to 8 atmospheres, 80%  stenosis to 0%, renal artery stent (Dr. Erlene Quan)  . Breast biopsy  1990s  . Tubal ligation  1969  . Transthoracic echocardiogram  12/2010    EF >70%, severe asymmetric LVH, mid-cavitary gradient is present, evidence of apical entrapment & limiated apical infarction (hypertrophic cardiomyopathy); normal RVSP; LA mod dilated; mild MR; trace TR; trace PV regurg   . Renal artery doppler  5/08/28/2012    distal abdominal aorta 0-49% diameter reduction; R renal artery stent =/>60% diameter reduction; L renal artery 1-59% diameter reduction     Social History: Social History  Substance Use Topics  . Smoking status: Current Some Day Smoker -- 0.25 packs/day    Types: Cigarettes  . Smokeless tobacco: Never Used  . Alcohol Use: No   Additional social history: 40 pack year hx.  Please also refer to relevant sections of EMR.  Family History: Family History  Problem Relation Age of Onset  . Heart disease Mother   . Lung disease Mother   . Hypertension Mother   . Hypertension Father   . Cancer Sister     x2  . Hypertension Sister  x4  . Lung disease Sister   . Hypertension Child     x3  . Heart murmur Child   . Diabetes Child    Allergies and Medications: Allergies  Allergen Reactions  . Caffeine Other (See Comments)    Heart flutters  . Chocolate Diarrhea  . Flax Seed [Bio-Flax]   . Magnesium-Containing Compounds   . Adhesive [Tape] Rash  . Ivp Dye [Iodinated Diagnostic Agents] Rash  . Tricor [Fenofibrate] Rash   No current facility-administered medications on file prior to encounter.   Current Outpatient Prescriptions on File Prior to Encounter  Medication Sig Dispense Refill  . alendronate (FOSAMAX) 70 MG tablet Take 70 mg by mouth once a week. Take with a full glass of water on an empty stomach.    Marland Kitchen aspirin 81 MG chewable tablet Chew 81 mg by mouth daily.    Marland Kitchen atenolol (TENORMIN) 100 MG tablet TAKE 1 TABLET BY MOUTH  TWICE DAILY FOR BLOOD PRESSURE AND HEART 180 tablet  0  . cholecalciferol (VITAMIN D) 1000 UNITS tablet Take 5,000 Units by mouth daily.    Marland Kitchen diltiazem (CARDIZEM CD) 180 MG 24 hr capsule Take 180 mg by mouth 2 (two) times daily.     . enalapril (VASOTEC) 20 MG tablet Take 20 mg by mouth 2 (two) times daily.    . fenofibrate micronized (LOFIBRA) 134 MG capsule Take 134 mg by mouth daily before breakfast.    . hydrochlorothiazide (MICROZIDE) 12.5 MG capsule Take 12.5 mg by mouth daily.    Marland Kitchen levothyroxine (SYNTHROID, LEVOTHROID) 75 MCG tablet take 1 tablet by mouth once daily 90 tablet 12  . Multiple Vitamin (MULTIVITAMIN WITH MINERALS) TABS Take 1 tablet by mouth daily.    . pravastatin (PRAVACHOL) 20 MG tablet Take 10 mg by mouth every other day. Monday Wednesday Friday    . pravastatin (PRAVACHOL) 40 MG tablet take 1 tablet by mouth at bedtime for CHOLESTEROL 90 tablet 3  . warfarin (COUMADIN) 2.5 MG tablet Take 2.5 mg by mouth daily.      Objective: BP 130/98 mmHg  Pulse 68  Temp(Src) 98.1 F (36.7 C) (Oral)  Resp 20  Ht 5\' 6"  (1.676 m)  Wt 140 lb (63.504 kg)  BMI 22.61 kg/m2  SpO2 97% Exam: General: Patient lying in bed, NAD  Eyes: Pupils Equal Round Reactive to light, Extraocular movements intact, Conjunctiva without redness or discharge ENTM: Moist mucosa membranes, normal oropharynx  Neck: No lymphadenopathy, no thyromegaly  Cardiovascular:  Paced Rhythm, no murmurs   Respiratory: CTAB, no wheezes, rhonchi Abdomen: BS+, no ttp, no distention  MSK: No lower extremity edema  Skin: no skin lesions, or bruises  Neuro:Cn 2-7 intact, Strength equal & normal in upper & lower extremities, finger to nose, normal sensation, 2+ reflexes upper and lower extremity  Psych: Normal affect  Labs and Imaging: CBC BMET   Recent Labs Lab 09/01/15 1405 09/01/15 1450  WBC 6.7  --   HGB 14.0 15.0  HCT 42.8 44.0  PLT 192  --     Recent Labs Lab 09/01/15 1405 09/01/15 1450  NA 142 144  K 3.5 3.3*  CL 103 102  CO2 30  --   BUN 11 14   CREATININE 0.75 0.60  GLUCOSE 89 92  CALCIUM 9.2  --      EKG - No ST elevations/depressions, paced rythmn, right bundle branch block  Troponin 0.01   Ct Head Wo Contrast  09/01/2015  CLINICAL DATA:  Left lower extremity  weakness.  Forgetfulness. EXAM: CT HEAD WITHOUT CONTRAST TECHNIQUE: Contiguous axial images were obtained from the base of the skull through the vertex without intravenous contrast. COMPARISON:  None. FINDINGS: There is age related volume loss. There is no intracranial mass, hemorrhage, extra-axial fluid collection, or midline shift. There is patchy small vessel disease throughout the centra semiovale bilaterally. There is small vessel disease in the anterior limbs of each internal and external capsule. Elsewhere gray-white compartments appear normal. No acute infarct is evident. Middle cerebral artery attenuation is symmetric bilaterally. The bony calvarium appears intact. The mastoid air cells are clear. No intraorbital lesions are evident. There is a sebaceous cyst in the soft tissues adjacent to the high right parietal bone measuring 1 x 1 cm. IMPRESSION: Age related volume loss with supratentorial small vessel disease. No acute infarct evident. No hemorrhage or mass effect. Critical Value/emergent results were called by telephone at the time of interpretation on 09/01/2015 at 2:35 pm to Felicie Morn PA, who verbally acknowledged these results. Electronically Signed   By: Bretta Bang III M.D.   On: 09/01/2015 14:36   Asiyah Mayra Reel, MD 09/01/2015, 5:59 PM PGY-1, [facility] FPTS Intern pager: [phone], text pages welcome

## 2015-09-01 NOTE — ED Notes (Signed)
Assisted pt to the bedside commade

## 2015-09-01 NOTE — Code Documentation (Signed)
74[facility].  Patient reports an episode on right leg shaking that started while in the bathroom.  Patient reports the shaking started in her foot and moved up her leg, and she reportedly had no control over it.  She had to hold onto the bathroom sink until she could sit down.  Code stroke called in the ED for right leg weakness.  Patient to CT.  Stroke team to the bedside.  NIHSS 0, see documentation for details and code stroke times.  Patient denies any symptoms at this time.  Dr. Amada JupiterKirkpatrick to the bedside.  No acute stroke treatment at this time.  Code stroke canceled.  Bedside handoff with ED RN Apolinar JunesBrandon.

## 2015-09-01 NOTE — Consult Note (Signed)
NEURO HOSPITALIST CONSULT NOTE   Requestig physician: Dr. Adriana Simas   Reason for Consult: Code strokr   History obtained from:  Patient    HPI:                                                                                                                                          Deanna Poole is an 75 y.o. female presenting to the ED as for multiple episodes f right leg jerking and inability to control it. These episodes started out with the right foot trembling and then would march up to the thigh. She states she could not control the leg and she would stand on her left leg. Initially EMS was called but she refused to come in by ambulance. Her husband finally drove her to hospital. Code stroke was called but after hearing story suspicion was more of a seizure than an stroke. Code stroke was canceled.   Past Medical History  Diagnosis Date  . Coronary artery disease   . Hypertension   . Thyroid disease   . 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 PAF (paroxysmal atrial fibrillation) (HCC)   . Hypertrophic cardiomyopathy (HCC)     apical-variant  . Hyperlipidemia   . COPD (chronic obstructive pulmonary disease) (HCC)   . Tobacco abuse     quit 12/2011  . History of nuclear stress test 12/2010    lexiscan; normal pattern of perfusion; post-stress EF 52%; normal, low risk study   . Renal artery stenosis (HCC)     right renal artery stent  . Unspecified vitamin D deficiency     Past Surgical History  Procedure Laterality Date  . Pacemaker insertion  07/2005    dual-chamber  . Cardiac catheterization  07/23/2005    non-critical CAD (Dr. Laurell Josephs)  . Renal artery angioplasty Right 10/20/1998    54mmx1.5cm PoweFlex balloon dilated up to 8 atmospheres, post-dilated with 1mmx1.5cm PowerFlex up to 8 atmospheres, 80% stenosis to 0%, renal artery stent (Dr. Erlene Quan)  . Breast biopsy  1990s  . Tubal ligation  1969  .  Transthoracic echocardiogram  12/2010    EF >70%, severe asymmetric LVH, mid-cavitary gradient is present, evidence of apical entrapment & limiated apical infarction (hypertrophic cardiomyopathy); normal RVSP; LA mod dilated; mild MR; trace TR; trace PV regurg   . Renal artery doppler  5/08/28/2012    distal abdominal aorta 0-49% diameter reduction; R renal artery stent =/>60% diameter reduction; L renal artery 1-59% diameter reduction     Family History  Problem Relation Age of Onset  . Heart disease Mother   . Lung disease Mother   . Hypertension Mother   . Hypertension Father   . Cancer Sister  x2  . Hypertension Sister     x4  . Lung disease Sister   . Hypertension Child     x3  . Heart murmur Child   . Diabetes Child      Social History:  reports that she has been smoking Cigarettes.  She has been smoking about 0.25 packs per day. She has never used smokeless tobacco. She reports that she does not drink alcohol or use illicit drugs.  Allergies  Allergen Reactions  . Caffeine Other (See Comments)    Heart flutters  . Chocolate Diarrhea  . Flax Seed [Bio-Flax]   . Magnesium-Containing Compounds   . Adhesive [Tape] Rash  . Ivp Dye [Iodinated Diagnostic Agents] Rash  . Tricor [Fenofibrate] Rash    MEDICATIONS:                                                                                                                     Current Facility-Administered Medications  Medication Dose Route Frequency Provider Last Rate Last Dose  . levETIRAcetam (KEPPRA) 500 mg in sodium chloride 0.9 % 100 mL IVPB  500 mg Intravenous Q12H Ulice Dash, PA-C       Current Outpatient Prescriptions  Medication Sig Dispense Refill  . alendronate (FOSAMAX) 70 MG tablet Take 70 mg by mouth once a week. Take with a full glass of water on an empty stomach.    Marland Kitchen aspirin 81 MG chewable tablet Chew 81 mg by mouth daily.    Marland Kitchen atenolol (TENORMIN) 100 MG tablet TAKE 1 TABLET BY MOUTH  TWICE DAILY FOR  BLOOD PRESSURE AND HEART 180 tablet 0  . cholecalciferol (VITAMIN D) 1000 UNITS tablet Take 5,000 Units by mouth daily.    Marland Kitchen diltiazem (CARDIZEM CD) 180 MG 24 hr capsule Take 180 mg by mouth 2 (two) times daily.     . enalapril (VASOTEC) 20 MG tablet Take 20 mg by mouth 2 (two) times daily.    . fenofibrate micronized (LOFIBRA) 134 MG capsule Take 134 mg by mouth daily before breakfast.    . hydrochlorothiazide (MICROZIDE) 12.5 MG capsule Take 12.5 mg by mouth daily.    Marland Kitchen levothyroxine (SYNTHROID, LEVOTHROID) 75 MCG tablet take 1 tablet by mouth once daily 90 tablet 12  . Multiple Vitamin (MULTIVITAMIN WITH MINERALS) TABS Take 1 tablet by mouth daily.    . pravastatin (PRAVACHOL) 20 MG tablet Take 10 mg by mouth every other day. Monday Wednesday Friday    . pravastatin (PRAVACHOL) 40 MG tablet take 1 tablet by mouth at bedtime for CHOLESTEROL 90 tablet 3  . warfarin (COUMADIN) 2.5 MG tablet Take 2.5 mg by mouth daily.        ROS:  History obtained from the patient  General ROS: negative for - chills, fatigue, fever, night sweats, weight gain or weight loss Psychological ROS: negative for - behavioral disorder, hallucinations, memory difficulties, mood swings or suicidal ideation Ophthalmic ROS: negative for - blurry vision, double vision, eye pain or loss of vision ENT ROS: negative for - epistaxis, nasal discharge, oral lesions, sore throat, tinnitus or vertigo Allergy and Immunology ROS: negative for - hives or itchy/watery eyes Hematological and Lymphatic ROS: negative for - bleeding problems, bruising or swollen lymph nodes Endocrine ROS: negative for - galactorrhea, hair pattern changes, polydipsia/polyuria or temperature intolerance Respiratory ROS: negative for - cough, hemoptysis, shortness of breath or wheezing Cardiovascular ROS: negative for - chest  pain, dyspnea on exertion, edema or irregular heartbeat Gastrointestinal ROS: negative for - abdominal pain, diarrhea, hematemesis, nausea/vomiting or stool incontinence Genito-Urinary ROS: negative for - dysuria, hematuria, incontinence or urinary frequency/urgency Musculoskeletal ROS: negative for - joint swelling or muscular weakness Neurological ROS: as noted in HPI Dermatological ROS: negative for rash and skin lesion changes   Blood pressure 158/60, pulse 62, temperature 98.1 F (36.7 C), temperature source Oral, resp. rate 18, height 5\' 6"  (1.676 m), weight 63.504 kg (140 lb), SpO2 94 %.   Neurologic Examination:                                                                                                      HEENT-  Normocephalic, no lesions, without obvious abnormality.  Normal external eye and conjunctiva.  Normal TM's bilaterally.  Normal auditory canals and external ears. Normal external nose, mucus membranes and septum.  Normal pharynx. Cardiovascular- regularly irregular rhythm, pulses palpable throughout   Lungs- chest clear, no wheezing, rales, normal symmetric air entry Abdomen- normal findings: bowel sounds normal Extremities- no edema Lymph-no adenopathy palpable Musculoskeletal-no joint tenderness, deformity or swelling Skin-warm and dry, no hyperpigmentation, vitiligo, or suspicious lesions  Neurological Examination Mental Status: Alert, oriented, thought content appropriate.  Speech fluent without evidence of aphasia.  Able to follow 3 step commands without difficulty. Cranial Nerves: II:  Visual fields grossly normal, pupils equal, round, reactive to light and accommodation III,IV, VI: ptosis not present, extra-ocular motions intact bilaterally V,VII: smile symmetric, facial light touch sensation normal bilaterally VIII: hearing normal bilaterally IX,X: uvula rises symmetrically XI: bilateral shoulder shrug XII: midline tongue extension Motor: Right : Upper  extremity   5/5    Left:     Upper extremity   5/5  Lower extremity   5/5     Lower extremity   5/5 Tone and bulk:normal tone throughout; no atrophy noted Sensory: Pinprick and light touch intact throughout, bilaterally Deep Tendon Reflexes: 2+ and symmetric throughout Plantars: Right: downgoing   Left: downgoing Cerebellar: normal finger-to-nose, and normal heel-to-shin test Gait: normal gait and station      Lab Results: Basic Metabolic Panel:  Recent Labs Lab 09/01/15 1405  NA 142  K 3.5  CL 103  CO2 30  GLUCOSE 89  BUN 11  CREATININE 0.75  CALCIUM 9.2    Liver Function Tests:  Recent  Labs Lab 09/01/15 1405  AST 30  ALT 18  ALKPHOS 43  BILITOT 0.9  PROT 7.6  ALBUMIN 4.2   No results for input(s): LIPASE, AMYLASE in the last 168 hours. No results for input(s): AMMONIA in the last 168 hours.  CBC:  Recent Labs Lab 09/01/15 1405  WBC 6.7  NEUTROABS 4.0  HGB 14.0  HCT 42.8  MCV 81.5  PLT 192    Cardiac Enzymes: No results for input(s): CKTOTAL, CKMB, CKMBINDEX, TROPONINI in the last 168 hours.  Lipid Panel: No results for input(s): CHOL, TRIG, HDL, CHOLHDL, VLDL, LDLCALC in the last 168 hours.  CBG: No results for input(s): GLUCAP in the last 168 hours.  Microbiology: No results found for this or any previous visit.  Coagulation Studies:  Recent Labs  09/01/15 1405  LABPROT 21.5*  INR 1.87*    Imaging: Ct Head Wo Contrast  09/01/2015  CLINICAL DATA:  Left lower extremity weakness.  Forgetfulness. EXAM: CT HEAD WITHOUT CONTRAST TECHNIQUE: Contiguous axial images were obtained from the base of the skull through the vertex without intravenous contrast. COMPARISON:  None. FINDINGS: There is age related volume loss. There is no intracranial mass, hemorrhage, extra-axial fluid collection, or midline shift. There is patchy small vessel disease throughout the centra semiovale bilaterally. There is small vessel disease in the anterior limbs of  each internal and external capsule. Elsewhere gray-white compartments appear normal. No acute infarct is evident. Middle cerebral artery attenuation is symmetric bilaterally. The bony calvarium appears intact. The mastoid air cells are clear. No intraorbital lesions are evident. There is a sebaceous cyst in the soft tissues adjacent to the high right parietal bone measuring 1 x 1 cm. IMPRESSION: Age related volume loss with supratentorial small vessel disease. No acute infarct evident. No hemorrhage or mass effect. Critical Value/emergent results were called by telephone at the time of interpretation on 09/01/2015 at 2:35 pm to Felicie Morn PA, who verbally acknowledged these results. Electronically Signed   By: Bretta Bang III M.D.   On: 09/01/2015 14:36       Assessment and plan per attending neurologist  Felicie Morn PA-C [facility] [phone]  09/01/2015, 2:45 PM   Assessment/Plan: 75 YO female with multiple episodes of right foot and leg jerking which is suspicious for simple partial seizure with jacksonian march from foot to whole leg. No history of seizure in the past. CT head obtained and showed no abnormalities. unfortunetly she cannot have a MRI due to pacemaker.   Recommend: 1) Keppra 500 mg BID 2) EEG  Ritta Slot, MD [facility] [phone]  If 7pm- 7am, please page neurology on call as listed in AMION.

## 2015-09-02 ENCOUNTER — Observation Stay (HOSPITAL_COMMUNITY): Payer: Medicare PPO

## 2015-09-02 DIAGNOSIS — I4891 Unspecified atrial fibrillation: Secondary | ICD-10-CM

## 2015-09-02 DIAGNOSIS — Z950 Presence of cardiac pacemaker: Secondary | ICD-10-CM | POA: Insufficient documentation

## 2015-09-02 DIAGNOSIS — R4701 Aphasia: Secondary | ICD-10-CM | POA: Insufficient documentation

## 2015-09-02 DIAGNOSIS — T829XXA Unspecified complication of cardiac and vascular prosthetic device, implant and graft, initial encounter: Secondary | ICD-10-CM | POA: Insufficient documentation

## 2015-09-02 DIAGNOSIS — R569 Unspecified convulsions: Secondary | ICD-10-CM | POA: Diagnosis not present

## 2015-09-02 LAB — CBC
HEMATOCRIT: 37.7 % (ref 36.0–46.0)
HEMOGLOBIN: 12 g/dL (ref 12.0–15.0)
MCH: 26.5 pg (ref 26.0–34.0)
MCHC: 31.8 g/dL (ref 30.0–36.0)
MCV: 83.2 fL (ref 78.0–100.0)
Platelets: 153 10*3/uL (ref 150–400)
RBC: 4.53 MIL/uL (ref 3.87–5.11)
RDW: 14.5 % (ref 11.5–15.5)
WBC: 4.8 10*3/uL (ref 4.0–10.5)

## 2015-09-02 LAB — BASIC METABOLIC PANEL
ANION GAP: 7 (ref 5–15)
BUN: 13 mg/dL (ref 6–20)
CO2: 29 mmol/L (ref 22–32)
Calcium: 8.8 mg/dL — ABNORMAL LOW (ref 8.9–10.3)
Chloride: 107 mmol/L (ref 101–111)
Creatinine, Ser: 0.69 mg/dL (ref 0.44–1.00)
GLUCOSE: 93 mg/dL (ref 65–99)
POTASSIUM: 3.8 mmol/L (ref 3.5–5.1)
Sodium: 143 mmol/L (ref 135–145)

## 2015-09-02 LAB — BRAIN NATRIURETIC PEPTIDE: B Natriuretic Peptide: 273.9 pg/mL — ABNORMAL HIGH (ref 0.0–100.0)

## 2015-09-02 LAB — PROTIME-INR
INR: 1.92 — AB (ref 0.00–1.49)
Prothrombin Time: 21.9 seconds — ABNORMAL HIGH (ref 11.6–15.2)

## 2015-09-02 MED ORDER — DILTIAZEM HCL ER COATED BEADS 180 MG PO CP24: 180.0000 mg | ORAL_CAPSULE | Freq: Every day | ORAL | Status: DC

## 2015-09-02 MED ORDER — DILTIAZEM HCL ER COATED BEADS 180 MG PO CP24
180.0000 mg | ORAL_CAPSULE | Freq: Two times a day (BID) | ORAL | Status: DC
  Administered 2015-09-02 – 2015-09-03 (×2): 180 mg via ORAL
  Filled 2015-09-02 (×2): qty 1

## 2015-09-02 NOTE — Progress Notes (Signed)
Subjective: No further typical events over night. She did note her right ankle "wobbled" when she was getting up to go to bathroom. This actually ment it turned inward but no jerking and no seizure like activity. She was able to evert her foot and walk.   Exam: Filed Vitals:   09/02/15 0422 09/02/15 0557  BP: 130/48 130/53  Pulse: 62 59  Temp:    Resp: 20 20       Gen: In bed, NAD MS: alert and oriented CN: 2-12 grossly intact Motor: MAEW Sensory: intact throughout   Pertinent Labs/Diagnostics: EEG: Clinical Interpretation: This normal EEG is recorded in the waking state. There was no seizure or seizure predisposition recorded on this study. Please note that a normal EEG does not preclude the possibility of epilepsy.    Deanna MornDavid Smith PA-C [facility] [phone]  Impression:  75 YO female with multiple episodes of right foot and leg jerking which is suspicious for simple partial seizure with jacksonian march from foot to whole leg. No history of seizure in the past. CT head obtained and showed no abnormalities. unfortunetly she cannot have a MRI due to pacemaker. Keppra started and no further events over night.    Recommendations: 1) continue keppra 500mg  BID.  2) I have requested outpatient follow up with neurology.  3) Please call with any further questions or concerns.   Deanna Poole Shey Yott, MD [facility] [phone]  If 7pm- 7am, please page neurology on call as listed in AMION.

## 2015-09-02 NOTE — Progress Notes (Signed)
ANTICOAGULATION CONSULT NOTE   Pharmacy Consult for warfarin Indication: atrial fibrillation  Allergies  Allergen Reactions  . Caffeine Other (See Comments)    Heart flutters  . Chocolate Diarrhea  . Flax Seed [Bio-Flax]   . Magnesium-Containing Compounds   . Adhesive [Tape] Rash  . Ivp Dye [Iodinated Diagnostic Agents] Rash  . Tricor [Fenofibrate] Rash    Patient Measurements: Height: 5\' 6"  (167.6 cm) Weight: 143 lb 8.3 oz (65.1 kg) IBW/kg (Calculated) : 59.3  Vital Signs: BP: 130/53 mmHg (06/09 0557) Pulse Rate: 59 (06/09 0557)  Labs:  Recent Labs  09/01/15 1405 09/01/15 1450 09/02/15 0541  HGB 14.0 15.0 12.0  HCT 42.8 44.0 37.7  PLT 192  --  153  APTT 35  --   --   LABPROT 21.5*  --  21.9*  INR 1.87*  --  1.92*  CREATININE 0.75 0.60 0.69    Estimated Creatinine Clearance: 56.9 mL/min (by C-G formula based on Cr of 0.69).   Assessment: Deanna Poole admitted 6/8 with concern for seizure. On warfarin PTA for afib. Admit INR 1.87, CBC stable with no reported bleeding and last dose taken on 6/7 pm.  Pharmacy asked to assist with dosing while inpatient.   PTA dose of warfarin was 1 mg on Tuesday and Saturday and 2.5 mg AOD's  INR today = 1.92 (on admit INR = 1.87)   Goal of Therapy:  INR 2-3 Monitor platelets by anticoagulation protocol: Yes   Plan:  1. Repeat Warfarin 2.5 mg x 1 tonight 2. Daily INR  Thank you Okey RegalLisa Dennise Raabe, PharmD [phone] 09/02/2015, 10:54 AM

## 2015-09-02 NOTE — Procedures (Signed)
History: 75 yo F with new onset right leg shaking episodes.   Sedation: None  Technique: This is a 21 channel routine scalp EEG performed at the bedside with bipolar and monopolar montages arranged in accordance to the international 10/20 system of electrode placement. One channel was dedicated to EKG recording.    Background: The background consists of intermixed alpha and beta activities. There is a well defined posterior dominant rhythm of 9 Hz that attenuates with eye opening. Sleep is not recorded.   Photic stimulation: Physiologic driving is not performed  EEG Abnormalities: None  Clinical Interpretation: This normal EEG is recorded in the waking state. There was no seizure or seizure predisposition recorded on this study. Please note that a normal EEG does not preclude the possibility of epilepsy.   Deanna SlotMcNeill Jocob Dambach, MD [facility] [phone]  If 7pm- 7am, please page neurology on call as listed in AMION.

## 2015-09-02 NOTE — Progress Notes (Signed)
Occupational Therapy Evaluation Patient Details Name: Deanna Poole MRN: [id number] DOB: Jun 30, 1940 Today's Date: 09/02/2015    History of Present Illness 75 y.o. female admitted for RLE weakness and trembling episodes. PMH significant for CAD, HTN, pacemaker, PAF, hypertrophic cardiomyopathy, HLD, COPD, renal arter stenosis w/ stent, and vitamin D deficiency.   Clinical Impression   Unsure of pt's PLOF or home information as pt told 2 different reports this session. Pt presents with RLE weakness and trembling, generalized weakness, balance and cognitive deficits. Pt required min assist for ADLs and transfers. Pt will benefit from continued acute OT to increase independence and safety with ADLs and mobility to allow for safe discharge home. If pt's family is able to provide 24/7 assistance and pt's cognition improves, I feel pt will be safe to d/c home with HHOT. Will continue to follow acutely.    Follow Up Recommendations  Home health OT;Supervision/Assistance - 24 hour    Equipment Recommendations  3 in 1 bedside comode    Recommendations for Other Services PT consult     Precautions / Restrictions Precautions Precautions: Fall Restrictions Weight Bearing Restrictions: No      Mobility Bed Mobility Overal bed mobility: Needs Assistance Bed Mobility: Supine to Sit     Supine to sit: Supervision     General bed mobility comments: HOB flat, no use of bedrails. Supervision for safety as pt was lethargic and confused at times.  Transfers Overall transfer level: Needs assistance Equipment used: None Transfers: Sit to/from Stand Sit to Stand: Min assist         General transfer comment: Min assist for balance upon standing. Pt with episode of RLE weakness and attempting to only stand on LLE and demonstrated increased unsteadiness. VCs for safe hand placement.    Balance Overall balance assessment: Needs assistance Sitting-balance support: No upper extremity  supported;Feet supported Sitting balance-Leahy Scale: Good     Standing balance support: No upper extremity supported;During functional activity Standing balance-Leahy Scale: Poor                              ADL Overall ADL's : Needs assistance/impaired     Grooming: Oral care;Wash/dry face;Wash/dry hands;Set up;Sitting Grooming Details (indicate cue type and reason): Pt became unsteady and less alert after returning from bathroom - sat down for safety. Upper Body Bathing: Set up;Sitting   Lower Body Bathing: Set up;Sit to/from stand   Upper Body Dressing : Set up;Sitting   Lower Body Dressing: Set up;Sit to/from stand   Toilet Transfer: Minimal assistance;Cueing for safety;Ambulation;Regular Teacher, adult educationToilet Toilet Transfer Details (indicate cue type and reason): assist for balance Toileting- Clothing Manipulation and Hygiene: Min guard;Sit to/from stand       Functional mobility during ADLs: Minimal assistance General ADL Comments: Pt had episode of RLE weakness when ambulating back from sink - pt not putting weight on RLE and was very unsteady. RN and MD notified     Vision Vision Assessment?: Vision impaired- to be further tested in functional context Additional Comments: Pt at first reported not wearing glasses and later in session reported wearing readers   Perception     Praxis      Pertinent Vitals/Pain Pain Assessment: No/denies pain     Hand Dominance Left   Extremity/Trunk Assessment Upper Extremity Assessment Upper Extremity Assessment: Overall WFL for tasks assessed (overall 4/5)   Lower Extremity Assessment Lower Extremity Assessment: Defer to PT evaluation   Cervical /  Trunk Assessment Cervical / Trunk Assessment: Normal   Communication Communication Communication: No difficulties   Cognition Arousal/Alertness: Lethargic (just woken up) Behavior During Therapy: Flat affect Overall Cognitive Status: Impaired/Different from baseline Area  of Impairment: Attention;Memory;Following commands;Safety/judgement;Problem solving   Current Attention Level: Selective Memory: Decreased short-term memory Following Commands: Follows one step commands with increased time Safety/Judgement: Decreased awareness of safety;Decreased awareness of deficits   Problem Solving: Slow processing;Difficulty sequencing;Requires verbal cues General Comments: Pt initially gave 1 report of home information and later on in session reported different living situation. Pt reported feeling "less alert" than her baseline. Pt also had difficulty sequencing through ADL tasks and had decreased safety awareness. Upon returning from bathroom, pt had underwear around her knees and was unaware of this even with VCs.    General Comments       Exercises       Shoulder Instructions      Home Living Family/patient expects to be discharged to:: Private residence Living Arrangements: Children (daughter, son-in-law, and grandson) Available Help at Discharge: Family;Available 24 hours/day (unsure if this accurate) Type of Home: House       Home Layout: Two level;Able to live on main level with bedroom/bathroom Alternate Level Stairs-Number of Steps: flight   Bathroom Shower/Tub: Walk-in shower;Door   [employer] Toilet: Standard     Home Equipment: None   Additional Comments: Pt initially reporting that she lives alone in a mobile home, however later in session pt reports she lives in  2 story home with her daughter who works and stay in [place] during the week and returns home on the weekend.      Prior Functioning/Environment Level of Independence: Independent        Comments: Drives, likes to read romance novels    OT Diagnosis: Generalized weakness;Cognitive deficits   OT Problem List: Decreased strength;Decreased range of motion;Decreased activity tolerance;Impaired balance (sitting and/or standing);Decreased cognition;Decreased safety  awareness;Decreased knowledge of use of DME or AE;Cardiopulmonary status limiting activity   OT Treatment/Interventions: Self-care/ADL training;Therapeutic exercise;Energy conservation;DME and/or AE instruction;Therapeutic activities;Patient/family education;Balance training    OT Goals(Current goals can be found in the care plan section) Acute Rehab OT Goals Patient Stated Goal: none stated OT Goal Formulation: With patient Time For Goal Achievement: 09/16/15 Potential to Achieve Goals: Good ADL Goals Pt Will Perform Grooming: with modified independence;standing Pt Will Perform Upper Body Bathing: with modified independence;sitting Pt Will Perform Lower Body Bathing: with modified independence;sit to/from stand Pt Will Transfer to Toilet: with modified independence;ambulating;bedside commode (over toilet) Pt Will Perform Toileting - Clothing Manipulation and hygiene: with min assist;sitting/lateral leans;sit to/from stand  OT Frequency: Min 3X/week   Barriers to D/C: Decreased caregiver support  Unsure accuracy of pt's responses       Co-evaluation              End of Session Equipment Utilized During Treatment: Gait belt;Oxygen Nurse Communication: Mobility status;Other (comment) (RLE weakness episode)  Activity Tolerance: Patient tolerated treatment well Patient left: in chair;with call bell/phone within reach;with chair alarm set   Time: 0826-0900 OT Time Calculation (min): 34 min Charges:  OT General Charges $OT Visit: 1 Procedure OT Evaluation $OT Eval Moderate Complexity: 1 Procedure OT Treatments $Self Care/Home Management : 8-22 mins G-Codes: OT G-codes **NOT FOR INPATIENT CLASS** Functional Assessment Tool Used: clinical judgement Functional Limitation: Self care Self Care Current Status (Z6109): At least 1 percent but less than 20 percent impaired, limited or restricted Self Care Goal Status (U0454): 0 percent impaired,  limited or restricted  Nils Pyle,  OTR/L Pager: [phone] 09/02/2015, 9:58 AM

## 2015-09-02 NOTE — Care Management Obs Status (Signed)
MEDICARE OBSERVATION STATUS NOTIFICATION   Patient Details  Name: Deanna LeyBarbara M Hank MRN: [id number] Date of Birth: 05-22-1940   Medicare Observation Status Notification Given:  Yes    Lawerance Sabalebbie Roscoe Witts, RN 09/02/2015, 1:56 PM

## 2015-09-02 NOTE — Evaluation (Signed)
Physical Therapy Evaluation Patient Details Name: Deanna Poole MRN: [id number] DOB: [birth year] Today's Date: 09/02/2015   History of Present Illness  75 y.o. female admitted for RLE weakness and trembling episodes; Neurology deems it Sz activity vs TIA /Scans negative; PMH significant for CAD, HTN, pacemaker, PAF, hypertrophic cardiomyopathy, HLD, COPD, renal arter stenosis w/ stent, and vitamin D deficiency.  Clinical Impression  Pt admitted with above diagnosis. Pt currently with functional limitations due to the deficits listed below (see PT Problem List). * Pt will benefit from skilled PT to increase their independence and safety with mobility to allow discharge to the venue listed below.   Pt will benefit will  HHPT at D/C, may need some incr supervision depending on cognition; pt reports that she will go to her son's home in [place] at D/C (? If this is accurate plan as pt gives conflicting info during PT eval); will continue to see for higher level balance/gait/safety activities as well as assess for any DME needs;     Follow Up Recommendations Home health PT;Supervision - Intermittent (supervision depending on cognitive status at D/C)    Equipment Recommendations  Other (comment) (TBD)    Recommendations for Other Services       Precautions / Restrictions Precautions Precautions: Fall Restrictions Weight Bearing Restrictions: No      Mobility  Bed Mobility Overal bed mobility: Needs Assistance Bed Mobility: Supine to Sit     Supine to sit: Supervision     General bed mobility comments: NT--pt OOB on PT arrival  Transfers Overall transfer level: Needs assistance Equipment used: None Transfers: Sit to/from Stand Sit to Stand: Min guard         General transfer comment: verbal cues for safety, hand placement and control of descent  Ambulation/Gait Ambulation/Gait assistance: Min assist;Min guard Ambulation Distance (Feet): 120 Feet Assistive device:  Rolling walker (2 wheeled) Gait Pattern/deviations: Step-through pattern;Decreased stride length     General Gait Details: amb 10' without RW and min/guard for safety and maneuvering through tighter space/obstacles in room; 110' with RW and min to min/guard for balance, assistane level decreasing with incr distance; O2 sats >92% on RA; O2 replaced after session  Stairs            Wheelchair Mobility    Modified Rankin (Stroke Patients Only)       Balance Overall balance assessment: Needs assistance Sitting-balance support: No upper extremity supported;Feet supported Sitting balance-Leahy Scale: Good     Standing balance support: No upper extremity supported;Bilateral upper extremity supported;During functional activity Standing balance-Leahy Scale: Fair Standing balance comment: pt is able to maintain standing without UE support                             Pertinent Vitals/Pain Pain Assessment: No/denies pain    Home Living Family/patient expects to be discharged to:: Private residence Living Arrangements: Children (dtr, son-in-law and grandson) Available Help at Discharge: Family;Available 24 hours/day Type of Home: House       Home Layout: Two level;Able to live on main level with bedroom/bathroom Home Equipment: None Additional Comments: pt reports she was visiting her dtr who lives in [place], her home is in [place], [place]--there she lives with the dtr/(and son-in-law) that works in [place] and comes back to [employer]; she reports her grandson is no help and doesn't do anything ; Later in session pt reports she was staying with her son in [place] when she  had to come to hospital (pt reports different info to OT per previous notes as well)    Prior Function Level of Independence: Independent         Comments: Drives, likes to read romance novels     Hand Dominance   Dominant Hand: Left    Extremity/Trunk Assessment   Upper Extremity  Assessment: Defer to OT evaluation;Overall WFL for tasks assessed           Lower Extremity Assessment: Overall WFL for tasks assessed      Cervical / Trunk Assessment: Normal  Communication   Communication: No difficulties  Cognition Arousal/Alertness: Awake/alert Behavior During Therapy: WFL for tasks assessed/performed Overall Cognitive Status: Impaired/Different from baseline Area of Impairment: Attention;Memory;Following commands;Safety/judgement;Problem solving   Current Attention Level: Sustained;Selective Memory: Decreased short-term memory Following Commands: Follows one step commands consistently;Follows multi-step commands with increased time Safety/Judgement: Decreased awareness of safety;Decreased awareness of deficits   Problem Solving: Requires verbal cues;Difficulty sequencing General Comments: pt reports her "speech is different"; labile while talking to family member on phone    General Comments General comments (skin integrity, edema, etc.): SpO2 at rest on 2L O2 [place] at 94%. Dropped to 86% on RA while ambulating to bathroom. Returned on 2L of O2 [place] and SpO2 increased immediately to 91-93%.    Exercises        Assessment/Plan    PT Assessment Patient needs continued PT services  PT Diagnosis Difficulty walking   PT Problem List Decreased balance;Decreased mobility;Decreased activity tolerance;Decreased cognition;Decreased safety awareness;Decreased knowledge of use of DME  PT Treatment Interventions Gait training;DME instruction;Functional mobility training;Therapeutic activities;Therapeutic exercise;Patient/family education;Balance training   PT Goals (Current goals can be found in the Care Plan section) Acute Rehab PT Goals Patient Stated Goal: none stated PT Goal Formulation: With patient Time For Goal Achievement: 09/16/15 Potential to Achieve Goals: Good    Frequency Min 3X/week   Barriers to discharge        Co-evaluation                End of Session Equipment Utilized During Treatment: Gait belt Activity Tolerance: Patient tolerated treatment well Patient left: in chair;with call bell/phone within reach;with chair alarm set      Functional Assessment Tool Used: clinical judgement Functional Limitation: Mobility: Walking and moving around Mobility: Walking and Moving Around Current Status [phone]([id number]): At least 1 percent but less than 20 percent impaired, limited or restricted Mobility: Walking and Moving Around Goal Status [phone]([id number]): At least 1 percent but less than 20 percent impaired, limited or restricted    Time: 1002 (and 1020-1029/-->23 total minutes/interrupted for cxr)-1016 PT Time Calculation (min) (ACUTE ONLY): 14 min   Charges:   PT Evaluation $PT Eval Low Complexity: 1 Procedure PT Treatments $Gait Training: 8-22 mins   PT G Codes:   PT G-Codes **NOT FOR INPATIENT CLASS** Functional Assessment Tool Used: clinical judgement Functional Limitation: Mobility: Walking and moving around Mobility: Walking and Moving Around Current Status ([phone]): At least 1 percent but less than 20 percent impaired, limited or restricted Mobility: Walking and Moving Around Goal Status [phone](G8979): At least 1 percent but less than 20 percent impaired, limited or restricted    [facility] HospitalWILLIAMS,Graeson Nouri 09/02/2015, 10:43 AM

## 2015-09-02 NOTE — Progress Notes (Signed)
Pt was admitted from ED per wheel chair accompanied by a nurse tech and  Pt family, self introduced to pt and family ID bracelet checked, admission package given and fall prevention plan discussed with pt, pt oriented to equipment and the unit, pt and family has been advised about valuable items, call light and phone within reach and pt able to demonstrate how to use it, treatment started as prescribed, will continue to monitor pt

## 2015-09-02 NOTE — Progress Notes (Signed)
Family Medicine Teaching Service Daily Progress Note Intern Pager: [phone]  Patient name: Deanna Poole Medical record number: [id number] Date of birth: 08/23/1940 Age: 75 y.o. Gender: female  Primary Care Provider: Pcp Not In System Consultants: Neurology Code Status: Full  Pt Overview and Major Events to Date:  6/8: Admitted for concern for seizures  Assessment and Plan: Deanna Poole is a 75 y.o. female presenting with right leg weakness and episode of shaking. PMH is significant for CHF, Atrial fibrillation, Hyperlipidemia, HTN, hx of tachycardia-bradycardia syndrome, CAD, Renal artery stenosis,   Intermittent leg jerking and Weakness: Could consider TIA, however patient with multiple resolving episodes of focal right leg weakness with jerking inconsistent with TIA. Per neurology presentation suspicious for simple partial seizure with jacksonian march from foot to whole leg. CT head negative for acute process. No signs of radiculopathy. No recent head trauma/no hx of stroke. No hx of new mediations. Denies any systemic symptoms of weight loss, fever or chills. No symptoms of elevated intercranial such as nausea or vomiting. No cancer hx, screening up-to-date, however 40 pack-year smoking hx. Electrolytes normal. CBC normal. Concern for cardiac cause, as EEG without seizure activity and bradycardia throughout evening.  - Monitoring on telemetry  - Continue Keppra 500 mg BID, per neurology despite lack of seizure activity - Not able to obtain MRI as patient with pacemaker --> will check type of pacemaker - EEG performed 09/02/15 and read with no seizure activity - Neuro checks every 4 hours  - Vitals per floor protocol  - Seizure precautions  - Pulse oximetry, sats above 92%  -ECHO in 2016 noted to have possible small mobile echodensity associated with RA lead --> could consider repeat ECHO/TEE  - PT/OT eval - Cardiology consulted. - Consulted electrophysiology for  pacemaker interrogation.   Hypothyroidism  - Continue home synthroid 75 mcg  - TSH normal at 1.594  HTN  - Hold home vasotec 20 mg BID and atenolol 100 mg BID for bradycardia  Slight hypokalemia: Improved to 3.8 s/p Kdur. K 3.3 on admission.  - Daily BMPs  HFpEF: EF 65-70%, Diastolic dysfunction. Crackles on lung exam though otherwise does not appear fluid overloaded.  - Atenolol 100 mg BID --> hold due to low blood pressure - Obtain BNP  - CXR  Paroxysmal Atrial Fibrillation: Pacemaker 07/2005 dual Chamber pacemaker for AFib and hx of tachy-brady syndrome. INR 1.87. Home Wafarin 2.5mg  every day except on Tuesdays and Saturday take 1mg  per patient.  - Warfarin per pharmacy  - Diltiazem 180 mg BID  Renal artery stenosis: right renal artery stent in place   HLD:  - Continue Pravastatin 40 mg   FEN/GI: Carb modified diet, saline lock  Prophylaxis: Warfarin   Subjective:  Patient had two leg shaking episodes though seemed more like weakness according to night nurse and OT. Both instances occurred returning from the bathroom.   Objective: Temp:  [98.1 F (36.7 C)] 98.1 F (36.7 C) (06/08 2133) Pulse Rate:  [45-115] 59 (06/09 0557) Resp:  [16-25] 20 (06/09 0557) BP: (100-163)/(48-98) 130/53 mmHg (06/09 0557) SpO2:  [89 %-97 %] 93 % (06/09 0557) Weight:  [140 lb (63.504 kg)-143 lb 8.3 oz (65.1 kg)] 143 lb 8.3 oz (65.1 kg) (06/08 2133) Physical Exam: General: Patient sitting up in recliner, in NAD Eyes: Pupils Equal Round Reactive to light, Extraocular movements intact ENTM: Moist mucosa membranes, normal oropharynx  Cardiovascular: Paced Rhythm, no murmurs, no JVD appreciated  Respiratory: Mild crackles throughout posterior lung fields, no  wheezes, rhonchi Abdomen: BS+, no ttp, no distention  MSK: No lower extremity edema  Skin: no skin lesions, or bruises  Neuro:Cn 2-7 intact, no focal deficits. AOx3. Psych: Normal affect  Laboratory:  Recent Labs Lab  09/01/15 1405 09/01/15 1450 09/02/15 0541  WBC 6.7  --  4.8  HGB 14.0 15.0 12.0  HCT 42.8 44.0 37.7  PLT 192  --  153    Recent Labs Lab 09/01/15 1405 09/01/15 1450 09/02/15 0541  NA 142 144 143  K 3.5 3.3* 3.8  CL 103 102 107  CO2 30  --  29  BUN CREATININE 0.75 0.60 0.69  CALCIUM 9.2  --  8.8*  PROT 7.6  --   --   BILITOT 0.9  --   --   ALKPHOS 43  --   --   ALT 18  --   --   AST 30  --   --   GLUCOSE 89 92 93     Imaging/Diagnostic Tests: Ct Head Wo Contrast  09/01/2015  CLINICAL DATA:  Left lower extremity weakness.  Forgetfulness. EXAM: CT HEAD WITHOUT CONTRAST TECHNIQUE: Contiguous axial images were obtained from the base of the skull through the vertex without intravenous contrast. COMPARISON:  None. FINDINGS: There is age related volume loss. There is no intracranial mass, hemorrhage, extra-axial fluid collection, or midline shift. There is patchy small vessel disease throughout the centra semiovale bilaterally. There is small vessel disease in the anterior limbs of each internal and external capsule. Elsewhere gray-white compartments appear normal. No acute infarct is evident. Middle cerebral artery attenuation is symmetric bilaterally. The bony calvarium appears intact. The mastoid air cells are clear. No intraorbital lesions are evident. There is a sebaceous cyst in the soft tissues adjacent to the high right parietal bone measuring 1 x 1 cm. IMPRESSION: Age related volume loss with supratentorial small vessel disease. No acute infarct evident. No hemorrhage or mass effect. Critical Value/emergent results were called by telephone at the time of interpretation on 09/01/2015 at 2:35 pm to Felicie Morn PA, who verbally acknowledged these results. Electronically Signed   By: Bretta Bang III M.D.   On: 09/01/2015 14:36   Dg Chest Port 1 View  09/02/2015  CLINICAL DATA:  Dyspnea.  Hypertension and COPD. EXAM: PORTABLE CHEST 1 VIEW COMPARISON:  06/03/2012 FINDINGS:  There is a right chest wall pacer device with lead in the right atrial appendage and right ventricle. The heart size appears normal. No pleural effusion or edema. No airspace consolidation. IMPRESSION: 1. No acute cardiopulmonary abnormalities. Electronically Signed   By: Signa Kell M.D.   On: 09/02/2015 10:29    Casey Burkitt, MD 09/02/2015, 9:40 AM PGY-1, [facility] Family Medicine FPTS Intern pager: [phone], text pages welcome

## 2015-09-02 NOTE — Consult Note (Signed)
Cardiology Consult    Patient ID: Deanna Poole MRN: [id number], DOB/AGE: 05/19/1940   Admit date: 09/01/2015 Date of Consult: 09/02/2015  Primary Physician: Pcp Not In System Primary Cardiologist: [facility] [place]  Requesting Provider: Dr. Clayton [facility]  Patient Profile    75 yo female with PMH of non-obstructive CAD/HTN/HLD/COPD/RAS/PAF and SSS with dual Chamber Medtronic pacemaker who presented to the [facility] ED with right leg weakness and jerking.   Past Medical History   Past Medical History  Diagnosis Date  . Coronary artery disease   . Hypertension   . Thyroid disease   . 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 PAF (paroxysmal atrial fibrillation) (HCC)   . Hypertrophic cardiomyopathy (HCC)     apical-variant  . Hyperlipidemia   . COPD (chronic obstructive pulmonary disease) (HCC)   . Tobacco abuse     quit 12/2011  . History of nuclear stress test 12/2010    lexiscan; normal pattern of perfusion; post-stress EF 52%; normal, low risk study   . Renal artery stenosis (HCC)     right renal artery stent  . Unspecified vitamin D deficiency     Past Surgical History  Procedure Laterality Date  . Pacemaker insertion  07/2005    dual-chamber  . Cardiac catheterization  07/23/2005    non-critical CAD (Dr. Laurell Josephs)  . Renal artery angioplasty Right 10/20/1998    60mmx1.5cm PoweFlex balloon dilated up to 8 atmospheres, post-dilated with 68mmx1.5cm PowerFlex up to 8 atmospheres, 80% stenosis to 0%, renal artery stent (Dr. Erlene Quan)  . Breast biopsy  1990s  . Tubal ligation  1969  . Transthoracic echocardiogram  12/2010    EF >70%, severe asymmetric LVH, mid-cavitary gradient is present, evidence of apical entrapment & limiated apical infarction (hypertrophic cardiomyopathy); normal RVSP; LA mod dilated; mild MR; trace TR; trace PV regurg   . Renal artery doppler  5/08/28/2012    distal abdominal  aorta 0-49% diameter reduction; R renal artery stent =/>60% diameter reduction; L renal artery 1-59% diameter reduction      Allergies  Allergies  Allergen Reactions  . Caffeine Other (See Comments)    Heart flutters  . Chocolate Diarrhea  . Flax Seed [Bio-Flax]   . Magnesium-Containing Compounds   . Adhesive [Tape] Rash  . Ivp Dye [Iodinated Diagnostic Agents] Rash  . Tricor [Fenofibrate] Rash    History of Present Illness    Deanna Poole is a 75 -year-old female who has seen Dr. Royann Shivers in the past (2015) but currently sees Dr. Katrinka Blazing (Cardiology) where she lives in [facility] [place] with PMH of non-obstructive CAD/HTN/HLD/COPD/RAS/PAF and SSS with dual Chamber Medtronic pacemaker. Last 2-D echocardiogram 08/2014 showed EF of 60-70%, mild mitral regurg, mild tricuspid regurg, and no WMA. Last heart cath was done in 2007 which showed essentially normal coronary arteries.  She reports being here visiting family, in her normal state of health. She is normally very active around her home and uses a walker to ambulate. Never has any chest pain or dyspnea while walking. On 09/01/2015 she developed sudden onset of right-sided leg weakness/trembling and felt like she did not have control over her ankle. Called for EMS transport to the hospital and initially considered code stroke. In the ED she was seen and evaluated by neurology who ultimately canceled the code stroke and continued with neurological workup. She had a CT of the head which was normal, but unable to have MRI due  to pacemaker. Did have an EEG evaluation done which was essentially normal. The patient is currently under the care of family medicine. They have asked cardiology to come interrogate her dual-chamber Medtronic pacemaker given nursing vital signs show 1 documented low pulse rate of 45 BPM. Patient denies ever having any palpitations, chest pain or dyspnea during this admission.   Inpatient Medications    . aspirin  81  mg Oral Daily  . [START ON 09/03/2015] diltiazem  180 mg Oral Daily  . enalapril  20 mg Oral BID  . levETIRAcetam  500 mg Intravenous Q12H  . levothyroxine  75 mcg Oral QAC breakfast  . sodium chloride flush  3 mL Intravenous Q12H  . Warfarin - Pharmacist Dosing Inpatient   Does not apply q1800    Family History    Family History  Problem Relation Age of Onset  . Heart disease Mother   . Lung disease Mother   . Hypertension Mother   . Hypertension Father   . Cancer Sister     x2  . Hypertension Sister     x4  . Lung disease Sister   . Hypertension Child     x3  . Heart murmur Child   . Diabetes Child     Social History    Social History   Social History  . Marital Status: Widowed    Spouse Name: N/A  . Number of Children: N/A  . Years of Education: N/A   Occupational History  . Not on file.   Social History Main Topics  . Smoking status: Current Some Day Smoker -- 0.25 packs/day    Types: Cigarettes  . Smokeless tobacco: Never Used  . Alcohol Use: No  . Drug Use: No  . Sexual Activity: Not on file   Other Topics Concern  . Not on file   Social History Narrative     Review of Systems    General:  No chills, fever, night sweats or weight changes.  Cardiovascular:  No chest pain, dyspnea on exertion, edema, orthopnea, palpitations, paroxysmal nocturnal dyspnea. Dermatological: No rash, lesions/masses Respiratory: No cough, dyspnea Urologic: No hematuria, dysuria Abdominal:   No nausea, vomiting, diarrhea, bright red blood per rectum, melena, or hematemesis Neurologic:  No visual changes, + wkns, changes in mental status. All other systems reviewed and are otherwise negative except as noted above.  Physical Exam    Blood pressure 131/48, pulse 60, temperature 97.5 F (36.4 C), temperature source Oral, resp. rate 20, height 5\' 6"  (1.676 m), weight 143 lb 8.3 oz (65.1 kg), SpO2 93 %.  General: Pleasant, NAD Psych: Normal affect. Neuro: Alert and  oriented X 3. Moves all extremities spontaneously. HEENT: Normal  Neck: Supple without bruits or JVD. Lungs:  Resp regular and unlabored, CTA. Heart: RRR no s3, s4, or murmurs. Abdomen: Soft, non-tender, non-distended, BS + x 4.  Extremities: No clubbing, cyanosis or edema. DP/PT/Radials 2+ and equal bilaterally.  Labs    Troponin [facility] of Care Test)  Recent Labs  09/01/15 1448  TROPIPOC 0.01   No results for input(s): CKTOTAL, CKMB, TROPONINI in the last 72 hours. Lab Results  Component Value Date   WBC 4.8 09/02/2015   HGB 12.0 09/02/2015   HCT 37.7 09/02/2015   MCV 83.2 09/02/2015   PLT 153 09/02/2015    Recent Labs Lab 09/01/15 1405  09/02/15 0541  NA 142  < > 143  K 3.5  < > 3.8  CL 103  < > 107  CO2 30  --  29  BUN 11  < > 13  CREATININE 0.75  < > 0.69  CALCIUM 9.2  --  8.8*  PROT 7.6  --   --   BILITOT 0.9  --   --   ALKPHOS 43  --   --   ALT 18  --   --   AST 30  --   --   GLUCOSE 89  < > 93  < > = values in this interval not displayed. Lab Results  Component Value Date   CHOL 147 03/16/2013   HDL 36* 03/16/2013   LDLCALC 67 03/16/2013   TRIG 219* 03/16/2013   Lab Results  Component Value Date   DDIMER 0.61* 04/29/2012     Radiology Studies    Ct Head Wo Contrast  09/01/2015  CLINICAL DATA:  Left lower extremity weakness.  Forgetfulness. EXAM: CT HEAD WITHOUT CONTRAST TECHNIQUE: Contiguous axial images were obtained from the base of the skull through the vertex without intravenous contrast. COMPARISON:  None. FINDINGS: There is age related volume loss. There is no intracranial mass, hemorrhage, extra-axial fluid collection, or midline shift. There is patchy small vessel disease throughout the centra semiovale bilaterally. There is small vessel disease in the anterior limbs of each internal and external capsule. Elsewhere gray-white compartments appear normal. No acute infarct is evident. Middle cerebral artery attenuation is symmetric bilaterally.  The bony calvarium appears intact. The mastoid air cells are clear. No intraorbital lesions are evident. There is a sebaceous cyst in the soft tissues adjacent to the high right parietal bone measuring 1 x 1 cm. IMPRESSION: Age related volume loss with supratentorial small vessel disease. No acute infarct evident. No hemorrhage or mass effect. Critical Value/emergent results were called by telephone at the time of interpretation on 09/01/2015 at 2:35 pm to Felicie Morn PA, who verbally acknowledged these results. Electronically Signed   By: Bretta Bang III M.D.   On: 09/01/2015 14:36   Dg Chest Port 1 View  09/02/2015  CLINICAL DATA:  Dyspnea.  Hypertension and COPD. EXAM: PORTABLE CHEST 1 VIEW COMPARISON:  06/03/2012 FINDINGS: There is a right chest wall pacer device with lead in the right atrial appendage and right ventricle. The heart size appears normal. No pleural effusion or edema. No airspace consolidation. IMPRESSION: 1. No acute cardiopulmonary abnormalities. Electronically Signed   By: Signa Kell M.D.   On: 09/02/2015 10:29    ECG & Cardiac Imaging    EKG: Paced Rate-64  ECHO: 08/2014  Summary There is normal left ventricular size. There is severe assymetric septal hypertrophy seen. Global left ventricular systolic function is normal. The estimated left ventricular ejection fraction is 65 - 70%. No significant gradient through LVOT Normal right ventricle size and function. A device lead is seen in the right ventricle. Mild mitral regurgitation is present. A device lead is visualized in the right atrium. Possible small mobile echodensity associated with RA lead, consider TEE if clinically indicated  Assessment & Plan    Deanna Poole is a 28 -year-old female who has seen Dr. Royann Shivers in the past (2015) but currently sees Dr. Katrinka Blazing (Cardiology) where she lives in [facility] [place] with PMH of non-obstructive CAD/HTN/HLD/COPD/RAS/PAF and SSS with dual Chamber Medtronic  pacemaker. Last 2-D echocardiogram 08/2014 showed EF of 60-70%, mild mitral regurg, mild tricuspid regurg, and no WMA. Last heart cath was done in 2007 which showed essentially normal coronary arteries.  1. A-Fib/SSS/Dual PPM (Medtronic): Her Last pacemaker interrogation  was 05/2015 with Dr. Katrinka [place]Smith in [place], with reading showing 17 treated/3 monitor AT/AF episodes, 3.9% AF burden and 0% atrial ATP efficacy. No changes were made to the device at that time. --Currently on long-term Coumadin therapy for the above stated. INR subtherapeutic yesterday and today at 1.87>>1.92. Does reports taking her medication as prescribed without missing doses.  --This patients CHA2DS2-VASc Score and unadjusted Ischemic Stroke Rate (% per year) is equal to 4.8 % stroke rate/year from a score of 4 Above score calculated as 1 point each if present [CHF, HTN, DM, Vascular=MI/PAD/Aortic Plaque, Age if 65-74, or Female] Above score calculated as 2 points each if present [Age > 75, or Stroke/TIA/TE] --Telemetry shows paced rhythm with rates between 60-80. I did not see any episodes of bradycardia. --Have called for repeat pacemaker interrogation.  2. Intermittent leg jerking/weakness: Being followed by neurology, EEG results and CT of the head normal. Has been placed on Keppra via neurology. Symptoms have since resolved during this admission. Denies any current weakness, or jerking activity. Pt ambulatory in the room with minimal assistance of nursing staff, with walker without difficulty.   3. HTN: Mostly controlled with one high reading on this admission. -- continue current medications   4. HLD: Continue home statin   5. COPD: Does not complain of any dyspnea, lungs clear on exam   6. RAS: Known right renal artery stent, has seen Dr. Allyson SabalBerry in the past.   Signed, Laverda PageLindsay Mikai Meints, NP-C Pager [phone] 09/02/2015, 2:07 PM

## 2015-09-03 ENCOUNTER — Observation Stay (HOSPITAL_BASED_OUTPATIENT_CLINIC_OR_DEPARTMENT_OTHER): Payer: Medicare PPO

## 2015-09-03 DIAGNOSIS — R569 Unspecified convulsions: Secondary | ICD-10-CM

## 2015-09-03 DIAGNOSIS — R4701 Aphasia: Secondary | ICD-10-CM | POA: Diagnosis not present

## 2015-09-03 DIAGNOSIS — Z950 Presence of cardiac pacemaker: Secondary | ICD-10-CM | POA: Diagnosis not present

## 2015-09-03 LAB — ECHOCARDIOGRAM COMPLETE
EERAT: 12.3
EWDT: 430 ms
FS: 38 % (ref 28–44)
Height: 66 in
IVS/LV PW RATIO, ED: 1.39
LA diam index: 2.29 cm/m2
LASIZE: 40 mm
LAVOL: 97.5 mL
LAVOLA4C: 93.1 mL
LAVOLIN: 55.8 mL/m2
LEFT ATRIUM END SYS DIAM: 40 mm
LV PW d: 10.8 mm — AB (ref 0.6–1.1)
LV TDI E'MEDIAL: 4.79
LV e' LATERAL: 7.18 cm/s
LVEEAVG: 12.3
LVEEMED: 12.3
LVOT area: 3.14 cm2
LVOT diameter: 20 mm
Lateral S' vel: 10.8 cm/s
MV Dec: 430
MV Peak grad: 3 mmHg
MV pk E vel: 88.3 m/s
MVPKAVEL: 54.8 m/s
TAPSE: 24.4 mm
TDI e' lateral: 7.18
Weight: 2296.31 oz

## 2015-09-03 LAB — BASIC METABOLIC PANEL
Anion gap: 10 (ref 5–15)
BUN: 19 mg/dL (ref 6–20)
CALCIUM: 9.5 mg/dL (ref 8.9–10.3)
CHLORIDE: 106 mmol/L (ref 101–111)
CO2: 24 mmol/L (ref 22–32)
CREATININE: 0.69 mg/dL (ref 0.44–1.00)
GFR calc Af Amer: >60 mL/min (ref >60)
GFR calc non Af Amer: >60 mL/min (ref >60)
GLUCOSE: 93 mg/dL (ref 65–99)
Potassium: 4 mmol/L (ref 3.5–5.1)
Sodium: 140 mmol/L (ref 135–145)

## 2015-09-03 LAB — PROTIME-INR
INR: 1.76 — ABNORMAL HIGH (ref 0.00–1.49)
Prothrombin Time: 20.5 seconds — ABNORMAL HIGH (ref 11.6–15.2)

## 2015-09-03 MED ORDER — LEVETIRACETAM 500 MG PO TABS: 500.0000 mg | ORAL_TABLET | Freq: Two times a day (BID) | ORAL | Status: DC

## 2015-09-03 MED ORDER — WARFARIN SODIUM 4 MG PO TABS
4.0000 mg | ORAL_TABLET | Freq: Once | ORAL | Status: AC
  Administered 2015-09-03: 4 mg via ORAL
  Filled 2015-09-03: qty 1

## 2015-09-03 MED ORDER — LEVETIRACETAM 500 MG PO TABS
500.0000 mg | ORAL_TABLET | Freq: Two times a day (BID) | ORAL | Status: DC
Start: 2015-09-03 — End: 2015-09-03

## 2015-09-03 NOTE — Discharge Summary (Signed)
[facility]  Patient name: Deanna Poole: [id number] Date of birth: [birth year] Age: 75 y.o. Gender: female Date of Admission: 09/01/2015  Date of Discharge: 09/03/2015 Admitting Physician: Moses MannersWilliam A Hensel, MD  Primary Care Provider: Pcp Not In System Consultants: Neurology, Cardiology  Indication for Hospitalization: right leg weakness and episode of shaking  Discharge Diagnoses/Problem List:  Active Problems:   Focal seizures    Cardiac pacemaker in situ   Expressive aphasia   PAF  [facility]/gait/safety activities)   Discharge Condition: Improved  Discharge Exam:  Blood pressure 155/61, pulse 61, temperature 97.8 F (36.6 C), temperature source Oral, resp. rate 16, height 5\' 6"  (1.676 m), weight 143 lb 8.3 oz (65.1 kg), SpO2 94 %. Physical Exam: General: Patient sitting at edge of bed, eating breakfast Eyes: Pupils Equal Round Reactive to light, Extraocular movements intact ENTM: Moist mucosa membranes, normal oropharynx  Cardiovascular:RRR, no murmurs, no JVD appreciated  Respiratory: Lungs CTAB, no wheezes, rhonchi Abdomen: BS+, no ttp, no distention  MSK: No lower extremity edema  Skin: no skin lesions, or bruises  Neuro:Cn 2-7 intact, no focal deficits. AOx3. Strength 5/5 of both upper and lower extremities, including dorsi and plantarflexion bilaterally. Could stand with ease.  Psych: Normal affect  Brief Hospital Course:  Deanna Poole is a 75 y.o. female who presented with right leg weakness and episode of shaking. PMH is significant for CHF, Atrial fibrillation, tobacco abuse, Hyperlipidemia, HTN, hx of tachycardia-bradycardia syndrome, CAD, and Renal artery stenosis.   Intermittent leg jerking and Weakness:  Patient was admitted for concern for seizure. She described multiple resolving episodes of focal right leg weakness with jerking, each lasting  last than 5 minutes. She expressed that she had slight confusion with these episodes and felt she had some trouble getting her words out afterwords (resolved at time of discharge). No recent head trauma/no history of stroke. No symptoms of elevated intercranial such as nausea or vomiting. No signs of radiculopathy. No medication changes. No cancer hx, screening up-to-date, however 40 pack-year smoking hx. Electrolytes normal. CBC normal. CVA was considered, but presentation thought to be inconsistent with TIA. Neurology was consulted and assessed as most consistent with simple partial seizure with Jacksonian march from foot to whole leg. CT head was negative for acute process. Neurology recommended starting Keppra 500 mg BID. EEG was performed 09/02/15 and did not show seizure activity. Neurology recommended continuing Keppra given classic presenting symptoms of simple partial seizure and following up in their outpatient clinic.   Given mention of hyperechoic density on RA lead from 2016 ECHO, cardiology recommended repeating to rule-out thrombus. It did not show clot or vegetation associated with RA lead nor did it show PFO, reducing concern for stroke even further. MRI initially was not performed due to pacemaker, though later clarified device was safe (Medtronic pacemaker replaced last year, serial # [id number], Model # [id number]). Suspicion for stroke not high enough to pursue further brain imaging at this time.   Paroxysmal Atrial Fibrillation with Pacemaker:  Concern for bradycardia to 40s-50s during first night of admission on telemetry, so cardiology was consulted. Home vasotec 20 mg BID and atenolol 100 mg BID were held for one day but then restarted, as cardiology did not find anything abnormal or find any bradycardia on their review of telemetry. Pacer interrogation showed normal pacer function with normal sensing and pacing (7.7% atrial tach/atrial fib burden since last interrogation 12/08/2014).  Diltiazem  and warfarin continued.   Hypothyroidism  TSH normal at 1.594. Continued home synthroid 75 mcg.   Slight hypokalemia:  K 3.3. on admission. Improved after Kdur.   HFpEF:  Patient initially required supplemental oxygen to maintain her saturation. ECHO from 2016 showed EF 65-70%, Diastolic dysfunction. Mild crackles heard on lung exam, that had resolved by time of discharge. No JVD or LE edema noted. CXR clear. BNP273.9, baseline unknown.   Issues for Follow Up:  1. Inquire about any further episodes of leg shaking, trouble with speech 2. Timeline for when patient can drive again 3. Neurology recommendations for continued treatment at hospital follow-up  Significant Procedures: EEG  Significant Labs and Imaging:   Recent Labs Lab 09/01/15 1405 09/01/15 1450 09/02/15 0541  WBC 6.7  --  4.8  HGB 14.0 15.0 12.0  HCT 42.8 44.0 37.7  PLT 192  --  153    Recent Labs Lab 09/01/15 1405 09/01/15 1448 09/01/15 1450 09/02/15 0541 09/03/15 0525  NA 142  --  144 143 140  K 3.5  --  3.3* 3.8 4.0  CL 103  --  102 107 106  CO2 30  --   --  29 24  GLUCOSE 89  --  92 93 93  BUN 11  --  CREATININE 0.75  --  0.60 0.69 0.69  CALCIUM 9.2  --   --  8.8* 9.5  MG  --  1.8  --   --   --   ALKPHOS 43  --   --   --   --   AST 30  --   --   --   --   ALT 18  --   --   --   --   ALBUMIN 4.2  --   --   --   --    Ct Head Wo Contrast  09/01/2015  CLINICAL DATA:  Left lower extremity weakness.  Forgetfulness. EXAM: CT HEAD WITHOUT CONTRAST TECHNIQUE: Contiguous axial images were obtained from the base of the skull through the vertex without intravenous contrast. COMPARISON:  None. FINDINGS: There is age related volume loss. There is no intracranial mass, hemorrhage, extra-axial fluid collection, or midline shift. There is patchy small vessel disease throughout the centra semiovale bilaterally. There is small vessel disease in the anterior limbs of each internal and external  capsule. Elsewhere gray-white compartments appear normal. No acute infarct is evident. Middle cerebral artery attenuation is symmetric bilaterally. The bony calvarium appears intact. The mastoid air cells are clear. No intraorbital lesions are evident. There is a sebaceous cyst in the soft tissues adjacent to the high right parietal bone measuring 1 x 1 cm. IMPRESSION: Age related volume loss with supratentorial small vessel disease. No acute infarct evident. No hemorrhage or mass effect. Critical Value/emergent results were called by telephone at the time of interpretation on 09/01/2015 at 2:35 pm to Felicie Morn PA, who verbally acknowledged these results. Electronically Signed   By: Bretta Bang III M.D.   On: 09/01/2015 14:36   Dg Chest Port 1 View  09/02/2015  CLINICAL DATA:  Dyspnea.  Hypertension and COPD. EXAM: PORTABLE CHEST 1 VIEW COMPARISON:  06/03/2012 FINDINGS: There is a right chest wall pacer device with lead in the right atrial appendage and right ventricle. The heart size appears normal. No pleural effusion or edema. No airspace consolidation. IMPRESSION: 1. No acute cardiopulmonary abnormalities. Electronically Signed   By: Signa Kell M.D.   On:  09/02/2015 10:29   Results/Tests Pending at Time of Discharge: None  Discharge Medications:    Medication List    TAKE these medications        alendronate 70 MG tablet  Commonly known as:  FOSAMAX  Take 70 mg by mouth once a week. Reported on 09/01/2015     aspirin 81 MG chewable tablet  Chew 81 mg by mouth daily.     atenolol 100 MG tablet  Commonly known as:  TENORMIN  TAKE 1 TABLET BY MOUTH  TWICE DAILY FOR BLOOD PRESSURE AND HEART     cholecalciferol 1000 units tablet  Commonly known as:  VITAMIN D  Take 1,000 Units by mouth daily.     diltiazem 180 MG 24 hr capsule  Commonly known as:  CARDIZEM CD  Take 180 mg by mouth 2 (two) times daily.     enalapril 20 MG tablet  Commonly known as:  VASOTEC  Take 20 mg by mouth  2 (two) times daily.     levETIRAcetam 500 MG tablet  Commonly known as:  KEPPRA  Take 1 tablet (500 mg total) by mouth every 12 (twelve) hours.     levothyroxine 75 MCG tablet  Commonly known as:  SYNTHROID, LEVOTHROID  take 1 tablet by mouth once daily     multivitamin with minerals Tabs tablet  Take 1 tablet by mouth daily.     pravastatin 20 MG tablet  Commonly known as:  PRAVACHOL  Take 10 mg by mouth at bedtime. Monday Wednesday Friday     warfarin 4 MG tablet  Commonly known as:  COUMADIN  Take 1-2.5 mg by mouth daily. Take 2.5mg  every day except on Tuesdays and Saturday take 1mg  per patient        Discharge Instructions: Please refer to Patient Instructions section of EMR for full details.  Patient was counseled important signs and symptoms that should prompt return to medical care, changes in medications, dietary instructions, activity restrictions, and follow up appointments.   Follow-Up Appointments: Follow-up Information    Follow up with [facility].   Why:  3 in 1 / BSC will be delivered to bedside prior to discharge   Contact information:   [address] [place] [address] [phone]       Follow up with [facility].   Why:  Home Health PT arranged   Contact information:   [address] [place] [address] [phone]       Schedule an appointment as soon as possible for a visit with Primary Care Doctor.   Why:  to review hospital stay and new medication      Casey Burkitt, MD 09/03/2015, 4:21 PM PGY-1, [facility]

## 2015-09-03 NOTE — Progress Notes (Addendum)
Occupational Therapy Treatment Patient Details Name: Deanna LeyBarbara M Poole MRN: [id number] DOB: 1941/02/29 Today's Date: 09/03/2015    History of present illness 75 y.o. female admitted for RLE weakness and trembling episodes; Neurology deems it Sz activity vs TIA /Scans negative; PMH significant for CAD, HTN, pacemaker, PAF, hypertrophic cardiomyopathy, HLD, COPD, renal arter stenosis w/ stent, and vitamin D deficiency.   OT comments  Pt progressing. Plan is for pt to d/c today.  Follow Up Recommendations  Home health OT;Supervision/Assistance - 24 hour    Equipment Recommendations  3 in 1 bedside comode    Recommendations for Other Services      Precautions / Restrictions Precautions Precautions: Fall Restrictions Weight Bearing Restrictions: No       Mobility Bed Mobility                  Transfers Overall transfer level: Needs assistance   Transfers: Sit to/from Stand Sit to Stand: Supervision              Balance  No LOB in session.                                 ADL Overall ADL's : Needs assistance/impaired     Grooming: Supervision/safety;Applying deodorant;Standing;Set up Grooming Details (indicate cue type and reason): deodorant already at sink but feel pt could have retrieved with supervision Upper Body Bathing: Supervision/ safety;Standing Upper Body Bathing Details (indicate cue type and reason): retrieved wash cloth, but soap already at sink Lower Body Bathing: Supervison/ safety (standing-washed bottom; went and retrieved wash cloth) Lower Body Bathing Details (indicate cue type and reason): soap already at sink Upper Body Dressing : Supervision/safety;Standing   Lower Body Dressing: Supervision/safety;Sitting/lateral leans Lower Body Dressing Details (indicate cue type and reason): went and retrieved clothes Toilet Transfer: Supervision/safety;Ambulation (sit to stand from bed)       Tub/ Shower Transfer: Tub  transfer;Min guard;Ambulation   Functional mobility during ADLs:  (Supervision-Min guard) General ADL Comments: Educated on safety such as sitting for LB ADLs, rugs/items on floor, recommended someone being with her for tub transfer and bathing at least first few times. explained that 3 in 1 can be used as shower chair. Educated on safe footwear.       Vision                     Perception     Praxis      Cognition  Awake/Alert Behavior During Therapy: WFL for tasks assessed/performed Overall Cognitive Status:  (unsure of baseline) Area of Impairment: Memory                General Comments: left water running for a while.     Extremity/Trunk Assessment               Exercises     Shoulder Instructions       General Comments      Pertinent Vitals/ Pain       Pain Assessment: No/denies pain  Home Living                                          Prior Functioning/Environment              Frequency Min 3X/week     Progress Toward Goals  OT  Goals(current goals can now be found in the care plan section)  Progress towards OT goals: Progressing toward goals-added tub goal which was covered today in session as well as LB dressing which was also addressed in session  Acute Rehab OT Goals Patient Stated Goal: none stated OT Goal Formulation: With patient Time For Goal Achievement: 09/16/15 Potential to Achieve Goals: Good ADL Goals Pt Will Perform Grooming: with modified independence;standing Pt Will Perform Upper Body Bathing: with modified independence;sitting Pt Will Perform Lower Body Bathing: with modified independence;sit to/from stand Pt Will Perform Lower Body Dressing: with modified independence;sit to/from stand (including gathering items) Pt Will Transfer to Toilet: with modified independence;ambulating;bedside commode (over toilet) Pt Will Perform Toileting - Clothing Manipulation and hygiene: with min  assist;sitting/lateral leans;sit to/from stand Pt Will Perform Tub/Shower Transfer: Tub transfer;with supervision;ambulating;3 in 1;with set-up  Plan Discharge plan remains appropriate    Co-evaluation                 End of Session     Activity Tolerance Patient tolerated treatment well   Patient Left in bed;with call bell/phone within reach;with family/visitor present (sitting EOB)   Nurse Communication          Time: 1610-9604 (some time spent with equipment guy talking to pt and having her sign sheet/pt talking to others)  OT Time Calculation (min): 24 min  Charges: OT General Charges $OT Visit: 1 Procedure OT Treatments $Self Care/Home Management : 8-22 mins  Earlie Raveling OTR/L [phone] 09/03/2015, 4:27 PM

## 2015-09-03 NOTE — Progress Notes (Signed)
Family Medicine Teaching Service Daily Progress Note Intern Pager: [phone]  Patient name: Deanna Poole Medical record number: [id number] Date of birth: 26-Jan-1941 Age: 75 y.o. Gender: female  Primary Care Provider: Pcp Not In System Consultants: Neurology, Cardiology Code Status: Full  Pt Overview and Major Events to Date:  6/8: Admitted for concern for seizures 6/9: Neurology has signed off 6/10: Cardiology has signed off  Assessment and Plan: Deanna Poole is a 75 y.o. female presenting with right leg weakness and episode of shaking. PMH is significant for CHF, Atrial fibrillation, Hyperlipidemia, HTN, hx of tachycardia-bradycardia syndrome, CAD, Renal artery stenosis,   Intermittent leg jerking and Weakness: Could consider TIA, however patient with multiple resolving episodes of focal right leg weakness with jerking inconsistent with TIA. Per neurology presentation classic for simple partial seizure with jacksonian march from foot to whole leg. CT head negative for acute process. No signs of radiculopathy. No recent head trauma/no hx of stroke. No hx of new mediations. Denies any systemic symptoms of weight loss, fever or chills. No symptoms of elevated intercranial such as nausea or vomiting. No cancer hx, screening up-to-date, however 40 pack-year smoking hx. Electrolytes normal. CBC normal. Concern for cardiac cause, as EEG without seizure activity and bradycardia throughout evening.  - Monitoring on telemetry  - Continue Keppra 500 mg BID, per neurology and follow-up with them after discharge.  - Initially did not obtain MRI as unsure of patient's type of pacemaker --> has [employer] pacemaker replaced last year, serial # [id number], Model # [id number] which is safe for MRI. Suspicion for stroke not high enough to pursue further brain imaging at this time.  - EEG performed 09/02/15 and read with no seizure activity - Neuro checks every 4 hours  - Vitals per floor protocol  -  Seizure precautions  - Pulse oximetry, sats above 92%  -ECHO in 2016 noted to have possible small mobile echodensity associated with RA lead --> could consider repeat ECHO/TEE  - PT/OT eval --> HH PT recommended, HH OT and 3-in-1 bedside commode recommended - Cardiology consulted --> Pacer interrogation showed normal pacer function with normal sensing and pacing. 7.7% atrial tach/atrial fib burden since last interrogation 12/08/2014. An echo from 08/2014 mentions a hyperechoic density on the RA lead, most likely fibrin stranding but ordered repeat echo to rule-out thrombus. Still unlikely to rule-in stroke, as patient would also need to have a PFO, as well.   Hypothyroidism  - Continue home synthroid 75 mcg  - TSH normal at 1.594  HTN  - Resume home vasotec 20 mg BID and atenolol 100 mg BID, as cardiology did not find anything abnormal or find any bradycardia on their review of telemetry  Slight hypokalemia: Improved to 4.0 < 3.8 s/p Kdur. K 3.3 on admission.  - Daily BMPs  HFpEF: EF 65-70%, Diastolic dysfunction. No crackles on lung exam today.  - Restart atenolol 100 mg BID --> held yesterday due to HR in the 40s - BNP 273.9.  - CXR clear.   Paroxysmal Atrial Fibrillation: Pacemaker 10/14/14 dual Chamber pacemaker for AFib and hx of tachy-brady syndrome. INR 1.87. Home Wafarin 2.5mg  every day except on Tuesdays and Saturday take 1mg  per patient.  - Warfarin per pharmacy  - Diltiazem 180 mg BID  Renal artery stenosis: right renal artery stent in place   HLD:  - Continue Pravastatin 40 mg   FEN/GI: Carb modified diet, saline lock  Prophylaxis: Warfarin   [facility]: [facility]  [facility]y follow-[facility], pending ECHO results.   Subjective:  Patient states she was able to walk around her room last night and this morning without difficulty or weakness. Daughter mentions speech occasionally still seems slurred at times, but patient feels her  speech is normal.   Objective: Temp:  [97.5 F (36.4 C)-97.9 F (36.6 C)] 97.7 F (36.5 C) (06/10 0555) Pulse Rate:  [58-65] 59 (06/10 0815) Resp:  [16-20] 16 (06/10 0555) BP: (129-148)/(48-57) 132/57 mmHg (06/10 0815) SpO2:  [91 %-94 %] 94 % (06/10 0555) Physical Exam: General: Patient sitting at edge of bed, eating breakfast Eyes: Pupils Equal Round Reactive to light, Extraocular movements intact ENTM: Moist mucosa membranes, normal oropharynx  Cardiovascular:RRR, no murmurs, no JVD appreciated  Respiratory: Lungs CTAB, no wheezes, rhonchi Abdomen: BS+, no ttp, no distention  MSK: No lower extremity edema  Skin: no skin lesions, or bruises  Neuro:Cn 2-7 intact, no focal deficits. AOx3. Strength 5/5 of both upper and lower extremities, including dorsi and plantarflexion bilaterally.  Psych: Normal affect  Laboratory:  Recent Labs Lab 09/01/15 1405 09/01/15 1450 09/02/15 0541  WBC 6.7  --  4.8  HGB 14.0 15.0 12.0  HCT 42.8 44.0 37.7  PLT 192  --  153    Recent Labs Lab 09/01/15 1405 09/01/15 1450 09/02/15 0541 09/03/15 0525  NA 142 144 143 140  K 3.5 3.3* 3.8 4.0  CL 103 102 107 106  CO2 30  --  29 24  BUN CREATININE 0.75 0.60 0.69 0.69  CALCIUM 9.2  --  8.8* 9.5  PROT 7.6  --   --   --   BILITOT 0.9  --   --   --   ALKPHOS 43  --   --   --   ALT 18  --   --   --   AST 30  --   --   --   GLUCOSE 89 92 93 93     Imaging/Diagnostic Tests: Dg Chest Port 1 View  09/02/2015  CLINICAL DATA:  Dyspnea.  Hypertension and COPD. EXAM: PORTABLE CHEST 1 VIEW COMPARISON:  06/03/2012 FINDINGS: There is a right chest wall pacer device with lead in the right atrial appendage and right ventricle. The heart size appears normal. No pleural effusion or edema. No airspace consolidation. IMPRESSION: 1. No acute cardiopulmonary abnormalities. Electronically Signed   By: Signa Kell M.D.   On: 09/02/2015 10:29    Casey Burkitt, MD 09/03/2015, 10:19  AM PGY-1, [facility] Family Medicine FPTS Intern pager: [phone], text pages welcome

## 2015-09-03 NOTE — Care Management Note (Signed)
Case Management Note  Patient Details  Name: Deanna Poole MRN: [id number] Date of Birth: 09/30/1940  Subjective/Objective:                    Action/Plan:  Plan is to d/c to home today with home health services. Pt will be d/c to son's home , address: [address], [id number]. Pt's cell # [phone]. Expected Discharge Date:    09/03/2015            Expected Discharge Plan:  Home w Home Health Services  In-House Referral:     Discharge planning Services  CM Consult  Post Acute Care Choice:    Choice offered to:  Patient  DME Arranged:  3-N-1 DME Agency:  [facility]  HH [facility]  [facility]  [facility] Referral made with Elmarie Shileyiffany, [phone]  Status of Service:  Completed, signed off  Medicare Important Message Given:    Date Medicare IM Given:    Medicare IM give by:    Date Additional Medicare IM Given:    Additional Medicare Important Message give by:     If discussed at Long Length of Stay Meetings, dates discussed:    Additional Comments:  Epifanio LeschesCole, Berneda Piccininni Hudson, ArizonaRN,BSN,CM [phone] 09/03/2015, 2:06 PM

## 2015-09-03 NOTE — Progress Notes (Signed)
Pacer interrogation showed normal pacer function with normal sensing and pacing.  7.7% atrial tach/atrial fib burden since last interrogation 12/08/2014.  2D echo pending.  If ok then no further cardiac workup per Dr. Leonides Sakeandolph's note.  She is on warfarin for history of PAF.  Will sign off.  Call with any questions.

## 2015-09-03 NOTE — Discharge Instructions (Signed)
Ms. Deanna Poole,  You were admitted for leg shaking concerning for seizure. CT head did not show sign of stroke. The electrical activity of your brain was checked with electroencephalogram (EEG) and did not show seizure at the time you had the test. However, the neurology team has a high enough suspicion that you had a simple partial seizure with "Jacksonian march from foot to whole leg" that they recommend continuing anti-seizure medication with Keppra 500 mg twice daily. You should receive a call from Neurology for an appointment. If you don't get a call within the next few days, please call [phone] to inquire about an appointment. You will need to refrain from driving at least until follow-up with Neurology.  The Cardiology team evaluated you for proper pacemaker function, and it was operating correctly. An echocardiogram was performed and did not show clot or vegetation associated with your pacemaker leads nor did it show any opening in the heart that would allow a clot to travel from your heart to your brain, reducing concern for stroke even further.   Home Health Physical Therapy and Occupational Therapy will be contacting you to set up times to work on your strength.   You most recent INR was 1.76 on 09/03/15. I would call to let the doctor who manages your coumadin know. You should be able to delay your next coumadin check due to testing in the hospital.   Thank you for letting us take care of you.

## 2015-09-03 NOTE — Progress Notes (Signed)
  Echocardiogram 2D Echocardiogram has been performed.  Deanna Poole, Deanna Poole 09/03/2015, 10:05 AM

## 2015-09-03 NOTE — Progress Notes (Signed)
ANTICOAGULATION CONSULT NOTE   Pharmacy Consult for warfarin Indication: atrial fibrillation  Allergies  Allergen Reactions  . Caffeine Other (See Comments)    Heart flutters  . Chocolate Diarrhea  . Flax Seed [Bio-Flax]   . Magnesium-Containing Compounds   . Adhesive [Tape] Rash  . Ivp Dye [Iodinated Diagnostic Agents] Rash  . Tricor [Fenofibrate] Rash    Patient Measurements: Height: 5\' 6"  (167.6 cm) Weight: 143 lb 8.3 oz (65.1 kg) IBW/kg (Calculated) : 59.3  Vital Signs: Temp: 97.7 F (36.5 C) (06/10 0555) Temp Source: Oral (06/10 0555) BP: 132/57 mmHg (06/10 0815) Pulse Rate: 59 (06/10 0815)  Labs:  Recent Labs  09/01/15 1405 09/01/15 1450 09/02/15 0541 09/03/15 0525  HGB 14.0 15.0 12.0  --   HCT 42.8 44.0 37.7  --   PLT 192  --  153  --   APTT 35  --   --   --   LABPROT 21.5*  --  21.9* 20.5*  INR 1.87*  --  1.92* 1.76*  CREATININE 0.75 0.60 0.69 0.69    Estimated Creatinine Clearance: 56.9 mL/min (by C-G formula based on Cr of 0.69).   Assessment: [age] yoF admitted 6/8 with concern for seizure. On warfarin PTA for afib. Admit INR 1.87, CBC stable with no reported bleeding and last dose taken on 6/7 pm.  Pharmacy asked to assist with dosing while inpatient.   PTA dose of warfarin was 1 mg on Tuesday and Saturday and 2.5 mg AOD's  INR today = 1.76   Goal of Therapy:  INR 2-3 Monitor platelets by anticoagulation protocol: Yes   Plan:  1. Coumadin 4 mg po x 1 today (prior to discharge, then resume home dose) 2. Daily INR  Thank you Okey RegalLisa Gwendoline Judy, PharmD [phone] 09/03/2015, 12:10 PM

## 2015-09-07 ENCOUNTER — Telehealth: Payer: Self-pay | Admitting: *Deleted

## 2015-09-07 NOTE — Telephone Encounter (Signed)
We just need the verbal ok for patient to get physical therapy. Jazmin Hartsell,CMA

## 2015-09-07 NOTE — Telephone Encounter (Signed)
Yes, I am happy to provide the verbal OK for PT and also sign whatever paperwork is needed at the back end.

## 2015-09-07 NOTE — Telephone Encounter (Signed)
Patient was seen on our service while Dr. Leveda AnnaHensel was inpatient.  Deanna Poole needs orders signed for PT 2x a week for 3 weeks.  Patient does have a PCP but there are located outside of  and patient is staying with her son currently during recovery.  Please advise if [facility] needs to contact her PCP or if we are able to give verbal orders for this care.  Phuoc Huy,CMA

## 2015-09-07 NOTE — Telephone Encounter (Signed)
I am happy to serve as the ordering physician.

## 2015-09-07 NOTE — Telephone Encounter (Signed)
Verbal ok [facility] to [facility]. Gerald Kuehl,CMA

## 2015-09-21 ENCOUNTER — Telehealth: Payer: Self-pay | Admitting: Neurology

## 2015-09-21 ENCOUNTER — Telehealth: Payer: Self-pay | Admitting: Internal Medicine

## 2015-09-21 DIAGNOSIS — R569 Unspecified convulsions: Secondary | ICD-10-CM

## 2015-09-21 MED ORDER — LEVETIRACETAM 500 MG PO TABS: 500.0000 mg | ORAL_TABLET | Freq: Two times a day (BID) | ORAL | Status: AC

## 2015-09-21 NOTE — Telephone Encounter (Signed)
Left message for Ms. Carolin SicksFrizzell letting her know I [facility] [place], to get her through until her appointment with Neurology.

## 2015-09-21 NOTE — Telephone Encounter (Signed)
Elder CyphersBarbara Poole 2040/04/01. She is a New patient for Dr. Karel JarvisAquino on August 1st. She was seen in the hospital for seizures. She was given medication there. She has one week left of the Keppra. She would like a refill on the Keppra. She said the hospital would not refill. [place] use [facility] [place].  Thank you

## 2015-09-21 NOTE — Telephone Encounter (Signed)
Called and left patient a message that she will need to have PCP fill medication until she is seen in office.

## 2015-09-21 NOTE — Telephone Encounter (Signed)
Pt called because she is from out of town and Dr. Sampson GoonFitzgerald treated her at the hospital and gave her a prescription of Keppra. She doesn't have another appointment until 10/25/15 with the specialist and will not have enough of this till last until then. She would like the doctor to call in one more refill at [facility] [place]. jw

## 2015-10-19 ENCOUNTER — Encounter: Payer: Self-pay | Admitting: Neurology

## 2015-10-19 ENCOUNTER — Ambulatory Visit (INDEPENDENT_AMBULATORY_CARE_PROVIDER_SITE_OTHER): Payer: Medicare PPO | Admitting: Neurology

## 2015-10-19 VITALS — BP 130/70 | HR 64 | Ht 66.0 in | Wt 141.0 lb

## 2015-10-19 DIAGNOSIS — G40109 Localization-related (focal) (partial) symptomatic epilepsy and epileptic syndromes with simple partial seizures, not intractable, without status epilepticus: Secondary | ICD-10-CM | POA: Diagnosis not present

## 2015-10-19 NOTE — Progress Notes (Signed)
Chart forwarded.  

## 2015-10-19 NOTE — Progress Notes (Signed)
NEUROLOGY CONSULTATION NOTE  Deanna Poole MRN: [id number] DOB: [birth year]  Referring provider: Hospital referral Onalee Hua, MD) Primary care provider: Darrol Jump, MD  Reason for consult:  seizure  HISTORY OF PRESENT ILLNESS: Deanna Poole is a 75 year old woman with HTN, CAD, PAF and cardiomyopathy status post pacemaker, COPD, hyperlipidemia, thyroid disease, and renal artery stenosis status post stent who presents for seizures.  History obtained by patient and hospital notes.  She was admitted to [facility] from 09/01/15 to 09/03/15 for episodic leg weakness and jerking.  She standing in the bathroom when suddenly her right foot started shaking, which spread up to the knee.  It lasted only seconds.  Her right leg felt mildly weak and she was leaning towards the right.  She also had trouble getting words out, although she denied word-finding difficulty.  These symptoms lasted about a day.  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  EEG was normal but did not capture an event.  Due to pacemaker, she is unable to have an MRI.  CT of head  Was personally reviewed and revealed age related atrophy and small vessel disease but no acute bleed, infarct or mass lesion.  CBC and BMP were unremarkable.  She has not had any recurrent spells.  The morning dose of Keppra makes her a little lightheaded.  She is frustrated, because she was told she couldn't drive until re-evaluated by a neurologist.  She lives in [place] and is only down in [place] to visit her son.  She denies prior history of seizure, head trauma or stroke.  PAST MEDICAL HISTORY: Past Medical History:  Diagnosis Date  . COPD (chronic obstructive pulmonary disease) (HCC)   . Coronary artery disease   . History of nuclear stress test 12/2010   lexiscan; normal pattern of perfusion; post-stress EF 52%; normal, low risk study   . Hyperlipidemia   . Hypertension   . Hypertrophic  cardiomyopathy (HCC)    apical-variant  . Pacemaker 07/2005   dual-chamber Medtronic EnRhythm; PAF, sinus node dysfunction  . PAF (paroxysmal atrial fibrillation) (HCC)   . Renal artery stenosis (HCC)    right renal artery stent  . Thyroid disease   . Tobacco abuse    quit 12/2011  . Unspecified vitamin D deficiency     PAST SURGICAL HISTORY: Past Surgical History:  Procedure Laterality Date  . BREAST BIOPSY  1990s  . CARDIAC CATHETERIZATION  07/23/2005   non-critical CAD (Dr. Laurell Josephs)  . PACEMAKER INSERTION  07/2005   dual-chamber  . RENAL ARTERY ANGIOPLASTY Right 10/20/1998   1mmx1.5cm PoweFlex balloon dilated up to 8 atmospheres, post-dilated with 58mmx1.5cm PowerFlex up to 8 atmospheres, 80% stenosis to 0%, renal artery stent (Dr. Erlene Quan)  . Renal Artery Doppler  5/08/28/2012   distal abdominal aorta 0-49% diameter reduction; R renal artery stent =/>60% diameter reduction; L renal artery 1-59% diameter reduction   . TRANSTHORACIC ECHOCARDIOGRAM  12/2010   EF >70%, severe asymmetric LVH, mid-cavitary gradient is present, evidence of apical entrapment & limiated apical infarction (hypertrophic cardiomyopathy); normal RVSP; LA mod dilated; mild MR; trace TR; trace PV regurg   . TUBAL LIGATION  1969    MEDICATIONS: Current Outpatient Prescriptions on File Prior to Visit  Medication Sig Dispense Refill  . aspirin 81 MG chewable tablet Chew 81 mg by mouth daily.    Marland Kitchen atenolol (TENORMIN) 100 MG tablet TAKE 1 TABLET BY MOUTH  TWICE DAILY FOR BLOOD PRESSURE  AND HEART 180 tablet 0  . cholecalciferol (VITAMIN D) 1000 UNITS tablet Take 1,000 Units by mouth daily.     Marland Kitchen diltiazem (CARDIZEM CD) 180 MG 24 hr capsule Take 180 mg by mouth 2 (two) times daily.     . enalapril (VASOTEC) 20 MG tablet Take 20 mg by mouth 2 (two) times daily.    Marland Kitchen levETIRAcetam (KEPPRA) 500 MG tablet Take 1 tablet (500 mg total) by mouth every 12 (twelve) hours. 70 tablet 0  . levothyroxine (SYNTHROID, LEVOTHROID)  75 MCG tablet take 1 tablet by mouth once daily 90 tablet 12  . Multiple Vitamin (MULTIVITAMIN WITH MINERALS) TABS Take 1 tablet by mouth daily.    . pravastatin (PRAVACHOL) 20 MG tablet Take 10 mg by mouth at bedtime. Monday Wednesday Friday    . warfarin (COUMADIN) 4 MG tablet Take 1-2.5 mg by mouth daily. Take 2.5mg  every day except on Tuesdays and Saturday take 1mg  per patient     No current facility-administered medications on file prior to visit.     ALLERGIES: Allergies  Allergen Reactions  . Caffeine Other (See Comments)    Heart flutters  . Chocolate Diarrhea  . Flax Seed [Bio-Flax]   . Magnesium-Containing Compounds   . Adhesive [Tape] Rash  . Ivp Dye [Iodinated Diagnostic Agents] Rash  . Tricor [Fenofibrate] Rash    FAMILY HISTORY: Family History  Problem Relation Age of Onset  . Heart disease Mother   . Lung disease Mother   . Hypertension Mother   . Hypertension Father   . Cancer Sister     x2  . Stroke Sister   . Hypertension Sister     x4  . Lung disease Sister   . Stroke Sister   . Hypertension Child     x3  . Heart murmur Child   . Diabetes Child     SOCIAL HISTORY: Social History   Social History  . Marital status: Widowed    Spouse name: N/A  . Number of children: N/A  . Years of education: N/A   Occupational History  . Not on file.   Social History Main Topics  . Smoking status: Current Some Day Smoker    Packs/day: 0.25    Types: Cigarettes  . Smokeless tobacco: Never Used  . Alcohol use No  . Drug use: No  . Sexual activity: Not on file   Other Topics Concern  . Not on file   Social History Narrative  . No narrative on file    REVIEW OF SYSTEMS: Constitutional: No fevers, chills, or sweats, no generalized fatigue, change in appetite Eyes: No visual changes, double vision, eye pain Ear, nose and throat: No hearing loss, ear pain, nasal congestion, sore throat Cardiovascular: No chest pain, palpitations Respiratory:  No  shortness of breath at rest or with exertion, wheezes GastrointestinaI: No nausea, vomiting, diarrhea, abdominal pain, fecal incontinence Genitourinary:  No dysuria, urinary retention or frequency Musculoskeletal:  No neck pain, back pain Integumentary: No rash, pruritus, skin lesions Neurological: as above Psychiatric: No depression, insomnia, anxiety Endocrine: No palpitations, fatigue, diaphoresis, mood swings, change in appetite, change in weight, increased thirst Hematologic/Lymphatic:  No purpura, petechiae. Allergic/Immunologic: no itchy/runny eyes, nasal congestion, recent allergic reactions, rashes  PHYSICAL EXAM: Vitals:   10/19/15 0759  BP: 130/70  Pulse: 64   General: No acute distress.  Patient appears well-groomed.  Head:  Normocephalic/atraumatic Eyes:  fundi examined but not visualized Neck: supple, no paraspinal tenderness, full range of motion Back:  No paraspinal tenderness Heart: regular rate and rhythm Lungs: Clear to auscultation bilaterally. Vascular: No carotid bruits. Neurological Exam: Mental status: alert and oriented to person, place, and time, recent and remote memory intact, fund of knowledge intact, attention and concentration intact, speech fluent and not dysarthric, language intact. Cranial nerves: CN I: not tested CN II: pupils equal, round and reactive to light, visual fields intact CN III, IV, VI:  full range of motion, no nystagmus, no ptosis CN V: facial sensation intact CN VII: upper and lower face symmetric CN VIII: hearing intact CN IX, X: gag intact, uvula midline CN XI: sternocleidomastoid and trapezius muscles intact CN XII: tongue midline Bulk & Tone: normal, no fasciculations. Motor:  5/5 throughout  Sensation:  Pinprick and vibration sensation intact. Deep Tendon Reflexes:  2+ throughout, toes downgoing.  Finger to nose testing:  Without dysmetria.  Heel to shin:  Without dysmetria.  Gait:  Normal station and stride.  Able to  turn and tandem walk. Romberg negative.  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    PLAN: 1.  Continue Keppra 500mg  twice daily.  If lightheadedness continues to be an issue, consider switching to the extended release form that can be taken just at bedtime. 2.  In regards to driving:   Since she did not have loss of consciousness and awareness, she may resume driving.  If it should occur while driving, she has the ability to pull the car over and stop the car using your left foot if not the right foot. 3.  She will establish care with a local neurologist at home.  She may follow up with me as needed.   Thank you for allowing me to take part in the care of this patient.  Shon Millet, DO  CC:  Darrol Jump, MD

## 2015-10-19 NOTE — Patient Instructions (Signed)
I do agree that you had a simple partial seizure.  Since you did not have loss of consciousness and awareness, you may resume driving.  If it should occur while driving, you have the ability and faculty to pull the car over and stop the car using your left foot if not the right foot.  Continue Keppra 500mg  twice daily Establish care with a neurologist at home. I will send my not to Dr. Earl Lites.

## 2015-10-25 ENCOUNTER — Ambulatory Visit: Payer: Medicare PPO | Admitting: Neurology

## 2018-07-09 ENCOUNTER — Encounter: Payer: Self-pay | Admitting: Gastroenterology

## 2020-04-26 DEATH — deceased
